# Patient Record
Sex: Female | Born: 1955 | Race: White | Hispanic: No | Marital: Married | Smoking: Never smoker
Health system: Southern US, Community
[De-identification: ages and names within clinical notes are randomized; demographics above are authoritative.]

## PROBLEM LIST (undated history)

## (undated) DIAGNOSIS — F419 Anxiety disorder, unspecified: Secondary | ICD-10-CM

## (undated) DIAGNOSIS — F32A Depression, unspecified: Secondary | ICD-10-CM

## (undated) DIAGNOSIS — E785 Hyperlipidemia, unspecified: Secondary | ICD-10-CM

## (undated) DIAGNOSIS — Z9889 Other specified postprocedural states: Secondary | ICD-10-CM

## (undated) DIAGNOSIS — Z923 Personal history of irradiation: Secondary | ICD-10-CM

## (undated) DIAGNOSIS — K648 Other hemorrhoids: Secondary | ICD-10-CM

## (undated) DIAGNOSIS — R112 Nausea with vomiting, unspecified: Secondary | ICD-10-CM

## (undated) DIAGNOSIS — F329 Major depressive disorder, single episode, unspecified: Secondary | ICD-10-CM

## (undated) DIAGNOSIS — K219 Gastro-esophageal reflux disease without esophagitis: Secondary | ICD-10-CM

## (undated) DIAGNOSIS — E039 Hypothyroidism, unspecified: Secondary | ICD-10-CM

## (undated) HISTORY — DX: Other hemorrhoids: K64.8

## (undated) HISTORY — PX: BREAST SURGERY: SHX581

## (undated) HISTORY — PX: GYNECOLOGIC CRYOSURGERY: SHX857

## (undated) HISTORY — DX: Other specified postprocedural states: R11.2

## (undated) HISTORY — DX: Depression, unspecified: F32.A

## (undated) HISTORY — DX: Hypothyroidism, unspecified: E03.9

## (undated) HISTORY — DX: Hyperlipidemia, unspecified: E78.5

## (undated) HISTORY — DX: Major depressive disorder, single episode, unspecified: F32.9

---
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
## 1998-02-22 ENCOUNTER — Encounter: Admission: RE | Admit: 1998-02-22 | Discharge: 1998-05-23 | Payer: Self-pay | Admitting: Anesthesiology

## 1998-11-07 ENCOUNTER — Other Ambulatory Visit: Admission: RE | Admit: 1998-11-07 | Discharge: 1998-11-07 | Payer: Self-pay | Admitting: Obstetrics and Gynecology

## 1999-11-19 ENCOUNTER — Other Ambulatory Visit: Admission: RE | Admit: 1999-11-19 | Discharge: 1999-11-19 | Payer: Self-pay | Admitting: Obstetrics and Gynecology

## 2000-11-26 ENCOUNTER — Other Ambulatory Visit: Admission: RE | Admit: 2000-11-26 | Discharge: 2000-11-26 | Payer: Self-pay | Admitting: Obstetrics and Gynecology

## 2001-11-29 ENCOUNTER — Other Ambulatory Visit: Admission: RE | Admit: 2001-11-29 | Discharge: 2001-11-29 | Payer: Self-pay | Admitting: Obstetrics and Gynecology

## 2002-12-04 ENCOUNTER — Other Ambulatory Visit: Admission: RE | Admit: 2002-12-04 | Discharge: 2002-12-04 | Payer: Self-pay | Admitting: Obstetrics and Gynecology

## 2003-12-13 ENCOUNTER — Other Ambulatory Visit: Admission: RE | Admit: 2003-12-13 | Discharge: 2003-12-13 | Payer: Self-pay | Admitting: Obstetrics and Gynecology

## 2004-12-16 ENCOUNTER — Other Ambulatory Visit: Admission: RE | Admit: 2004-12-16 | Discharge: 2004-12-16 | Payer: Self-pay | Admitting: Obstetrics and Gynecology

## 2005-12-17 ENCOUNTER — Other Ambulatory Visit: Admission: RE | Admit: 2005-12-17 | Discharge: 2005-12-17 | Payer: Self-pay | Admitting: Obstetrics and Gynecology

## 2007-01-04 ENCOUNTER — Other Ambulatory Visit: Admission: RE | Admit: 2007-01-04 | Discharge: 2007-01-04 | Payer: Self-pay | Admitting: Obstetrics and Gynecology

## 2008-01-05 ENCOUNTER — Other Ambulatory Visit: Admission: RE | Admit: 2008-01-05 | Discharge: 2008-01-05 | Payer: Self-pay | Admitting: Obstetrics and Gynecology

## 2009-01-07 ENCOUNTER — Ambulatory Visit: Payer: Self-pay | Admitting: Obstetrics and Gynecology

## 2009-01-07 ENCOUNTER — Encounter: Payer: Self-pay | Admitting: Obstetrics and Gynecology

## 2009-01-07 ENCOUNTER — Other Ambulatory Visit: Admission: RE | Admit: 2009-01-07 | Discharge: 2009-01-07 | Payer: Self-pay | Admitting: Obstetrics and Gynecology

## 2010-01-08 ENCOUNTER — Other Ambulatory Visit: Admission: RE | Admit: 2010-01-08 | Discharge: 2010-01-08 | Payer: Self-pay | Admitting: Obstetrics and Gynecology

## 2010-01-08 ENCOUNTER — Ambulatory Visit: Payer: Self-pay | Admitting: Obstetrics and Gynecology

## 2011-01-16 ENCOUNTER — Other Ambulatory Visit (HOSPITAL_COMMUNITY)
Admission: RE | Admit: 2011-01-16 | Discharge: 2011-01-16 | Disposition: A | Discharge status: Home / Self Care | Payer: BC Managed Care – PPO | Source: Ambulatory Visit | Attending: Obstetrics and Gynecology | Admitting: Obstetrics and Gynecology

## 2011-01-16 ENCOUNTER — Other Ambulatory Visit: Payer: Self-pay | Admitting: Obstetrics and Gynecology

## 2011-01-16 ENCOUNTER — Encounter (INDEPENDENT_AMBULATORY_CARE_PROVIDER_SITE_OTHER): Payer: BC Managed Care – PPO | Admitting: Obstetrics and Gynecology

## 2011-01-16 DIAGNOSIS — R809 Proteinuria, unspecified: Secondary | ICD-10-CM

## 2011-01-16 DIAGNOSIS — N950 Postmenopausal bleeding: Secondary | ICD-10-CM

## 2011-01-16 DIAGNOSIS — Z124 Encounter for screening for malignant neoplasm of cervix: Secondary | ICD-10-CM | POA: Insufficient documentation

## 2011-01-16 DIAGNOSIS — Z01419 Encounter for gynecological examination (general) (routine) without abnormal findings: Secondary | ICD-10-CM

## 2011-02-17 ENCOUNTER — Ambulatory Visit (INDEPENDENT_AMBULATORY_CARE_PROVIDER_SITE_OTHER): Payer: BC Managed Care – PPO | Admitting: General Surgery

## 2011-02-17 ENCOUNTER — Encounter (INDEPENDENT_AMBULATORY_CARE_PROVIDER_SITE_OTHER): Payer: Self-pay | Admitting: General Surgery

## 2011-02-17 VITALS — BP 122/72 | HR 68 | Temp 98.0°F | Ht 65.0 in | Wt 207.4 lb

## 2011-02-17 DIAGNOSIS — K648 Other hemorrhoids: Secondary | ICD-10-CM

## 2011-02-17 NOTE — Patient Instructions (Signed)
High fiber diet-at least 30 grams per day.  Drink a lot of fluids.

## 2011-02-18 ENCOUNTER — Encounter (INDEPENDENT_AMBULATORY_CARE_PROVIDER_SITE_OTHER): Payer: Self-pay | Admitting: General Surgery

## 2011-02-18 DIAGNOSIS — K648 Other hemorrhoids: Secondary | ICD-10-CM | POA: Insufficient documentation

## 2011-02-18 NOTE — Progress Notes (Signed)
Subjective:     Patient ID: Laure Kidney, female   DOB: 1956-08-08, 55 y.o.   MRN: [id number]    BP 122/72  Pulse 68  Temp(Src) 98 F (36.7 C) (Temporal)  Ht 5\' 5"  (1.651 m)  Wt 207 lb 6.4 oz (94.076 kg)  BMI 34.51 kg/m2    HPI He is a 55 year old female referred for evaluation and treatment of symptomatic hemorrhoid disease. She states she is has had intermittent episodes of bleeding and prolapse but they have become more frequent recently. This occurs after she strains to have a bowel movement. She does not have to manually reduce the hemorrhoids. She's been using topical therapy but continues to have the symptoms and is interested in treatment for this. She did have a colonoscopy 5 years ago. When she does not have to strain to have a bowel movement she does not have the pain or prolapse or bleeding.  Review of Systems  Constitutional: Negative.   Respiratory: Negative.   Cardiovascular: Negative.   Gastrointestinal: Positive for anal bleeding.  Hematological: Negative.        Objective:   Physical Exam Gen.: Obese female in no acute distress pleasant and cooperative.  Abdomen: Soft, nontender, nondistended, no masses.  Anorectal: External skin tags are present in the left lateral position and right anterior lateral position. No fissure is noted. No abscess is present. On digital rectal exam normal sphincter tone is noted. No masses are felt. No blood is present.  Anoscopy: There are moderate sized internal hemorrhoids in the right anterior lateral and posterior lateral positions. There is a small internal hemorrhoid in the left position.    Assessment:     Bleeding and prolapsing internal hemorrhoids. This tends to occur when she is constipated and straining to have a bowel movement. She does not take laxatives.    Plan:     High fiber diet. Will attempt rubber band ligation in the office today.  Rubber band ligation was attempted of the right anterior lateral  hemorrhoid. However she was too uncomfortable and did not tolerate procedure so it was aborted. Will have her concentrate on high fiber diet and return in one month. Will attempt rubber band ligation at that time.

## 2011-02-26 DIAGNOSIS — Z1211 Encounter for screening for malignant neoplasm of colon: Secondary | ICD-10-CM

## 2011-05-07 ENCOUNTER — Telehealth: Payer: Self-pay

## 2011-05-07 NOTE — Telephone Encounter (Signed)
Some people will spot or bleed at this point. Has this happen before? For the moment now Rx needed.

## 2011-05-07 NOTE — Telephone Encounter (Signed)
TAKES ESTRADIOL 1MG . EVERY DAY AND GENERIC PROVERA DAYS 1-12 OF EVERY MONTH. ON DAY 12 YESTERDAY STARTED SPOTTING. SHE STATES YOU DID DETAILED TESTING MAY 2012. ANY REASON FOR CONCERN?

## 2011-05-07 NOTE — Telephone Encounter (Signed)
PT CLARIFIED AGAIN THAT AFTER OUR NURSE CALL OF 02/16/11 SHE HAS HAD LIGHT SPOTTING TO LIGHT PERIOD FLOW SINCE STARTING THE CYCLIC PROVERA. STATES IS CONSISTENTLY WILL START ABOUT DAY 10 OF TAKING THE PROVERA AND LAST UP TO  DAY 10-14 OF EVERY MONTH AND JUST HASN'T CALLED BACK PRIOR TO THIS MONTH. AND STATES FLOW WAS A LITTLE BIT HEAVIER THIS MONTH THAN THE PREVIOUS ONES."JUST ENOUGH TO KEEP A PAD ON" UNTIL IT COMPLETELY STOPS.

## 2011-05-08 NOTE — Telephone Encounter (Signed)
PER DR. G. THIS IS NORMAL FOR DOING CYCLIC PROVERA AND JUST KEEP TRACK OF IT IF IT CHANGES OR PERSISTS 7+DAYS OR VERY HEAVY. CALL BACK OR SET UP OV. PT. NOTIFIED OF ALL INFO.

## 2011-08-26 ENCOUNTER — Other Ambulatory Visit: Payer: Self-pay | Admitting: *Deleted

## 2011-08-26 MED ORDER — MEDROXYPROGESTERONE ACETATE 5 MG PO TABS: ORAL_TABLET | ORAL | Status: DC

## 2011-08-26 NOTE — Telephone Encounter (Signed)
Pt called asking for refill on provera 5 mg. rx sent to [facility]

## 2011-10-09 ENCOUNTER — Telehealth: Payer: Self-pay | Admitting: *Deleted

## 2011-10-09 NOTE — Telephone Encounter (Signed)
Pt called to follow up with bleeding she is taking provera days 1-12 every month. Pt is still having some bleeding, pt states it starts off light then heavy then light again. No extreme heavy flow. I told pt that according to 9/13 note this normal and to keep a calender. Pt told to call back if very heavy and or lasting more than 7 days.

## 2012-01-07 ENCOUNTER — Encounter: Payer: Self-pay | Admitting: Obstetrics and Gynecology

## 2012-01-07 DIAGNOSIS — J45909 Unspecified asthma, uncomplicated: Secondary | ICD-10-CM | POA: Insufficient documentation

## 2012-01-19 ENCOUNTER — Encounter: Payer: Self-pay | Admitting: Obstetrics and Gynecology

## 2012-01-19 ENCOUNTER — Ambulatory Visit (INDEPENDENT_AMBULATORY_CARE_PROVIDER_SITE_OTHER): Payer: BC Managed Care – PPO | Admitting: Obstetrics and Gynecology

## 2012-01-19 VITALS — BP 124/78 | Ht 65.0 in | Wt 199.0 lb

## 2012-01-19 DIAGNOSIS — Z01419 Encounter for gynecological examination (general) (routine) without abnormal findings: Secondary | ICD-10-CM

## 2012-01-19 MED ORDER — ESTRADIOL 1 MG PO TABS: 1.0000 mg | ORAL_TABLET | Freq: Every day | ORAL | Status: DC

## 2012-01-19 MED ORDER — MEDROXYPROGESTERONE ACETATE 5 MG PO TABS: ORAL_TABLET | ORAL | Status: DC

## 2012-01-19 NOTE — Progress Notes (Signed)
Patient came to see me today for her annual GYN exam. She continues happy on her HRT with relief of her symptoms. She has been on HRT since 2006. She is getting light monthly withdrawal period she is having no pelvic pain or dyspareunia. She does have diminished libido. She does not have vaginal dryness. She is due for her mammogram in July. She has had 2 normal bone densities. She was treated with cryosurgery for cervical dysplasia greater than 20 years ago. She has hemorrhoids that we give her suppositories for. Dr. Purnell Shoemaker tried to ban them  but was too uncomfortable. Patient's mother had breast cancer in her early 81s and is now deceased. There is no family history of ovarian cancer. She had a paternal aunt who had breast cancer at approximately 18.  Physical examination:Kim Julian Reil present. HEENT within normal limits. Neck: Thyroid not large. No masses. Supraclavicular nodes: not enlarged. Breasts: Examined in both sitting and lying  position. No skin changes and no masses. Abdomen: Soft no guarding rebound or masses or hernia. Pelvic: External: Within normal limits. BUS: Within normal limits. Vaginal:within normal limits. Good estrogen effect. No evidence of cystocele rectocele or enterocele. Cervix: clean. Uterus: Normal size and shape. Adnexa: No masses. Rectovaginal exam: Confirmatory and negative. Extremities: Within normal limits.  Assessment: #1. Menopausal symptoms #2. Hemorrhoids #3. Diminished libido #4. Mother with breast cancer in her early 74s.  Plan: Discussed increased risk of breast cancer with HRT. Discussed patch estrogen to reduce risk of DVT. For the moment patient will continue on oral estrogen. She will investigate BRCA1 and BRCA2 testing with her insurance. She was started on testosterone cream 2% at bedtime cliterally for libido.

## 2012-01-19 NOTE — Patient Instructions (Signed)
Investigate testing for BRCA-1 and BRCA-2.

## 2012-01-20 LAB — URINALYSIS W MICROSCOPIC + REFLEX CULTURE
Bilirubin Urine: NEGATIVE
Glucose, UA: NEGATIVE mg/dL
Specific Gravity, Urine: 1.023 (ref 1.005–1.030)
pH: 6 (ref 5.0–8.0)

## 2012-01-21 LAB — URINE CULTURE

## 2012-03-04 ENCOUNTER — Telehealth: Payer: Self-pay | Admitting: *Deleted

## 2012-03-04 NOTE — Telephone Encounter (Signed)
Pt called c/o yeast infection, requesting diflucan rx, pt informed Dr.G is out of office until July 31st, pt will try OTC monistat and if no relief she call on Monday and make office visit or go to urgent care over the weekend.

## 2012-07-06 ENCOUNTER — Telehealth: Payer: Self-pay | Admitting: *Deleted

## 2012-07-06 MED ORDER — PRAMOXINE-HC 1-2.5 % EX CREA: TOPICAL_CREAM | Freq: Three times a day (TID) | CUTANEOUS | Status: DC

## 2012-07-06 NOTE — Telephone Encounter (Signed)
Pt informed, rx sent 

## 2012-07-06 NOTE — Addendum Note (Signed)
Addended by: Aura Camps on: 07/06/2012 03:30 PM   Modules accepted: Orders

## 2012-07-06 NOTE — Telephone Encounter (Signed)
Pt called requesting new Rx for Pramosone 2.5% for hemorrhoids, old Rx has expired. Okay to fill?

## 2012-07-06 NOTE — Telephone Encounter (Signed)
Yes

## 2012-09-12 ENCOUNTER — Other Ambulatory Visit: Payer: Self-pay | Admitting: Obstetrics and Gynecology

## 2013-01-20 ENCOUNTER — Ambulatory Visit (INDEPENDENT_AMBULATORY_CARE_PROVIDER_SITE_OTHER): Payer: BC Managed Care – PPO | Admitting: Gynecology

## 2013-01-20 ENCOUNTER — Encounter: Payer: Self-pay | Admitting: Gynecology

## 2013-01-20 VITALS — BP 124/80 | Ht 65.0 in | Wt 214.0 lb

## 2013-01-20 DIAGNOSIS — Z79890 Hormone replacement therapy (postmenopausal): Secondary | ICD-10-CM

## 2013-01-20 DIAGNOSIS — Z01419 Encounter for gynecological examination (general) (routine) without abnormal findings: Secondary | ICD-10-CM

## 2013-01-20 MED ORDER — PROGESTERONE MICRONIZED 100 MG PO CAPS: 100.0000 mg | ORAL_CAPSULE | Freq: Every day | ORAL | Status: DC

## 2013-01-20 MED ORDER — ESTRADIOL 0.05 MG/24HR TD PTTW: 1.0000 | MEDICATED_PATCH | TRANSDERMAL | Status: DC

## 2013-01-20 NOTE — Patient Instructions (Addendum)
Wean from hormones as we discussed. Call me if you have any issues. Otherwise followup in one year for annual exam.

## 2013-01-20 NOTE — Progress Notes (Signed)
Teresa Love 10/11/1955 [id number]        57 y.o.  [id number] for annual exam.  Several issues noted below. Former patient of Dr. Eda Paschal  Past medical history,surgical history, medications, allergies, family history and social history were all reviewed and documented in the EPIC chart. ROS:  Was performed and pertinent positives and negatives are included in the history.  Exam: Kim assistant Filed Vitals:   01/20/13 0851  BP: 124/80  Height: 5\' 5"  (1.651 m)  Weight: 214 lb (97.07 kg)   General appearance  Normal Skin grossly normal Head/Neck normal with no cervical or supraclavicular adenopathy thyroid normal Lungs  clear Cardiac RR, without RMG Abdominal  soft, nontender, without masses, organomegaly or hernia Breasts  examined lying and sitting without masses, retractions, discharge or axillary adenopathy. Pelvic  Ext/BUS/vagina  normal   Cervix  normal   Uterus  axial to anteverted, normal size, shape and contour, midline and mobile nontender   Adnexa  Without masses or tenderness    Anus and perineum  normal   Rectovaginal  normal sphincter tone without palpated masses or tenderness.    Assessment/Plan:  57 y.o. [id number] female for annual exam.   1. HRT. Patient currently on estradiol 1 mg daily and Provera 5 mg the first 12 days of each month and having monthly withdrawal bleeding. Has been on HRT since the perimenopause for a number of years. I reviewed the issues of HRT to include the WHI study with increased risk of stroke heart attack DVT. The ACOG and NAMS statements for lowest dose per shortest period of time reviewed. Advantages of transdermal over oral first-pass effect also reviewed. The patient has never tried to wean. She's going to go ahead and do that now and see how she does. If she does have significant symptoms and wants to reinitiate HRT I prescribed Vivelle 0.05 patches and Prometrium 100 mg nightly 2. Decreased libido.  The patient has been using testosterone  2% applied around her clitoris although admits to not using regularly. I reviewed the issues of the use of testosterone with libido and the risks to include absorption with hair growth acne weight gain adverse lipid profile. Patient will decide if she wants to continue and if so we will refill at the pharmacy for her. 3. Mammography 02/2012. History of breast cancer in her mother age 40. No other history of ovarian or breast cancer. She previously discussed possible genetic testing with Dr. Eda Paschal but never followed up for this.  Again reviewed the issues of genetic testing to include what she would do with the results, increased surveillance such as breast and ovarian up to and including prophylactic surgeries to include mastectomy and salpingo-oophorectomy. The issues about passing mutation to children also discussed. After lengthy discussion she wants to discuss with a genetic counselor and we'll arrange this at [facility] 4. Pap smear 2012. No Pap smear done today. History of dysplasia with cryosurgery around age 41. Normal Pap smears since then. Plan repeat Pap smear next year at 3 year interval. 5. DEXA 2007 normal. Plan repeat at age 18. Increase calcium vitamin D reviewed. 6. Colonoscopy 7 years ago. Plan repeat at 10 year interval. 7. Health maintenance. Sees Dr. Donette Larry routinely and he does her lab work. Followup 1 year, sooner if any issues.    Dara Lords MD, 9:24 AM 01/20/2013

## 2013-01-24 ENCOUNTER — Telehealth: Payer: Self-pay | Admitting: *Deleted

## 2013-01-24 ENCOUNTER — Encounter: Payer: Self-pay | Admitting: Gynecology

## 2013-01-24 ENCOUNTER — Telehealth: Payer: Self-pay | Admitting: Genetic Counselor

## 2013-01-24 NOTE — Telephone Encounter (Signed)
Message copied by Aura Camps on Tue [name] 24, 2013  9:08 AM ------      Message from: Dara Lords      Created: Fri [name] 20, 2013 10:33 AM       Patient needs appointment with genetic counselor at [facility] in reference to family history of breast cancer to consider genetic testing ------

## 2013-01-24 NOTE — Telephone Encounter (Signed)
LVOM FOR PT TO RETURN CALL IN RE TO GENETIC APPT.  °

## 2013-01-24 NOTE — Telephone Encounter (Signed)
Referral faxed to [facility]. They will contact pt to schedule the below appt.  

## 2013-01-25 NOTE — Telephone Encounter (Signed)
appt 03/20/13 @ 11:00 am

## 2013-02-16 ENCOUNTER — Encounter: Payer: Self-pay | Admitting: Gynecology

## 2013-02-16 ENCOUNTER — Telehealth: Payer: Self-pay | Admitting: *Deleted

## 2013-02-16 ENCOUNTER — Ambulatory Visit (INDEPENDENT_AMBULATORY_CARE_PROVIDER_SITE_OTHER): Payer: BC Managed Care – PPO | Admitting: Gynecology

## 2013-02-16 DIAGNOSIS — L293 Anogenital pruritus, unspecified: Secondary | ICD-10-CM

## 2013-02-16 DIAGNOSIS — N898 Other specified noninflammatory disorders of vagina: Secondary | ICD-10-CM

## 2013-02-16 LAB — WET PREP FOR TRICH, YEAST, CLUE
Clue Cells Wet Prep HPF POC: NONE SEEN
Trich, Wet Prep: NONE SEEN

## 2013-02-16 MED ORDER — FLUCONAZOLE 150 MG PO TABS: 150.0000 mg | ORAL_TABLET | Freq: Once | ORAL | Status: DC

## 2013-02-16 MED ORDER — ESTRADIOL 0.05 MG/24HR TD PTTW: 1.0000 | MEDICATED_PATCH | TRANSDERMAL | Status: DC

## 2013-02-16 MED ORDER — MEDROXYPROGESTERONE ACETATE 5 MG PO TABS: ORAL_TABLET | ORAL | Status: DC

## 2013-02-16 NOTE — Patient Instructions (Signed)
Take one Diflucan pill. Repeat if needed. Use the other extra pills as needed for yeast symptoms.

## 2013-02-16 NOTE — Telephone Encounter (Signed)
Pt called requesting 90 day supply for vivelle dot patch 0.5 mg and provera 5 mg to mail order pharmacy, rx sent.

## 2013-02-16 NOTE — Progress Notes (Signed)
Patient presents with 2 day history of vaginal itching and discharge. Returned from vacation where it was for a hot. No odor. Does have a history of recurrent yeast infections in the past.  Exam was Administrator, [employer] vagina with thick white cakey discharge. Cervix normal. Uterus normal size midline mobile nontender. Adnexa without masses or tenderness.  Assessment and plan: Exam and symptoms consistent with yeast. Wet prep is negative. Treat with Diflucan 150 mg tablet #4 total given to have available to repeat as needed as she does get frequent yeast infections. Followup if symptoms persist or worsen.

## 2013-02-23 ENCOUNTER — Telehealth: Payer: Self-pay | Admitting: *Deleted

## 2013-02-23 MED ORDER — PROGESTERONE MICRONIZED 100 MG PO CAPS: 100.0000 mg | ORAL_CAPSULE | Freq: Every day | ORAL | Status: DC

## 2013-02-23 NOTE — Telephone Encounter (Signed)
Pt called incorrect rx was sent on 02/16/13 provera 5 mg was sent when progesterone 100 mg cap 1 po daily should have been sent. This rx was sent with refill until annual 2015.

## 2013-03-07 ENCOUNTER — Encounter: Payer: Self-pay | Admitting: Gynecology

## 2013-03-20 ENCOUNTER — Ambulatory Visit (HOSPITAL_BASED_OUTPATIENT_CLINIC_OR_DEPARTMENT_OTHER): Payer: BC Managed Care – PPO | Admitting: Genetic Counselor

## 2013-03-20 ENCOUNTER — Other Ambulatory Visit: Payer: BC Managed Care – PPO | Admitting: Lab

## 2013-03-20 ENCOUNTER — Encounter: Payer: Self-pay | Admitting: Genetic Counselor

## 2013-03-20 DIAGNOSIS — Z803 Family history of malignant neoplasm of breast: Secondary | ICD-10-CM

## 2013-03-20 NOTE — Progress Notes (Signed)
Dr.  Colin Broach requested a consultation for genetic counseling and risk assessment for Teresa Love, a 57 y.o. female, for discussion of her family history of breast and kidney cancer.  She presents to clinic today to discuss the possibility of a genetic predisposition to cancer, and to further clarify her risks, as well as her family members' risks for cancer.   HISTORY OF PRESENT ILLNESS: Teresa Love is a 57 y.o. female with no personal history of cancer.  She has been having mammagrams since she was 27 and has not found any lumps.  Her first colonoscopy was at age 25 and no polyps were found.  She is concerned about her risk for breast cancer and became teary when discussing her family history.  Past Medical History  Diagnosis Date  . Hyperlipidemia   . Depression   . Hemorrhoids, internal, with bleeding   . Perimenopausal vasomotor symptoms     STARTED HRT 01/2005  . Cervical dysplasia     Past Surgical History  Procedure Laterality Date  . Appendectomy  1975  . Breast surgery      Reduction  . Gynecologic cryosurgery  age 67    History   Social History  . Marital Status: Married    Spouse Name: N/A    Number of Children: 2  . Years of Education: N/A   Occupational History  .     Social History Main Topics  . Smoking status: Never Smoker   . Smokeless tobacco: Never Used  . Alcohol Use: 0.5 oz/week    1 drink(s) per week  . Drug Use: No  . Sexually Active: Yes    Birth Control/ Protection: Post-menopausal   Other Topics Concern  . None   Social History Narrative  . None    REPRODUCTIVE HISTORY AND PERSONAL RISK ASSESSMENT FACTORS: Menarche was at age 1.   postmenopausal Uterus Intact: yes Ovaries Intact: yes G3P2A1, first live birth at age 89  She has not previously undergone treatment for infertility.   Oral Contraceptive use: 10 years   She has used HRT in the past.    FAMILY HISTORY:  We obtained a detailed, 4-generation family  history.  Significant diagnoses are listed below: Family History  Problem Relation Age of Onset  . Hypertension Mother   . Breast cancer Mother 29  . Kidney cancer Mother 64  . Heart disease Father   . Hypertension Father   . Breast cancer Paternal Aunt     Age 49  . Tuberculosis Maternal Grandmother   The patient reports that she does not keep in touch with many of her family members so she is not fully aware of their cancer history.  Patient's ancestors are of [place] descent. There is no reported Ashkenazi Jewish ancestry. There si no  known consanguinity.  GENETIC COUNSELING ASSESSMENT: Teresa Love is a 57 y.o. female with a family history of breast and kidney canc3er which somewhat suggestive of a hereditary breast cancer syndrome and predisposition to cancer. We, therefore, discussed and recommended the following at today's visit.   DISCUSSION: We reviewed the characteristics, features and inheritance patterns of hereditary cancer syndromes. We also discussed genetic testing, including the appropriate family members to test, the process of testing, insurance coverage and turn-around-time for results. We discussed that identification of a hereditary cancer syndrome may help her care providers tailor the patients medical management. If a mutation indicating hereditary cancer syndrome is detected in this case, the National Comprehensive Cancer  Network recommendations would include increased cancer surveillance and possibly prophylactic surgery. If a mutation is detected, the patient will be referred back to the referring provider and to any additional appropriate care providers to discuss the relevant options.   If a mutation is not found in the patient, cancer surveillance options would be discussed for the patient according to the appropriate standard National Comprehensive Cancer Network and American Cancer Society guidelines, with consideration of their personal and family history  risk factors. In this case, the patient will be referred back to their care providers for discussions of management.   In order to estimate her chance of having a BRCA mutation, we used statistical models ([employer] and [employer]) and laboratory data that take into account her personal medical history, family history and ancestry.  Because each model is different, there can be a lot of variability in the risks they give.  Therefore, these numbers must be considered a rough range and not a precise risk of having a BRCA mutation.  These models estimate that she has approximately a 0.77-5% chance of having a mutation. Based on this assessment of her family and personal history, genetic testing is recommended.  Based on the patient's personal and family history, statistical models ([employer] and [employer])  and literature data were used to estimate her risk of developing breast cancer. These estimate her lifetime risk of developing breast cancer to be approximately 15% to 22.5%. This estimation does not take into account any genetic testing results. We recommended Teresa Love pursue genetic testing for Breast and ovarian cancer panel at [facility].   PLAN: After considering the risks, benefits, and limitations, CRISTIAN GRIEVES provided informed consent to pursue genetic testing and the blood sample will be sent to [facility] for analysis of the Breast/ovarian cancer apnel. We discussed the implications of a positive, negative and/ or variant of uncertain significance genetic test result. Results should be available within approximately 3-4 weeks' time, at which point they will be disclosed by telephone to Teresa Love, as will any additional her recommendations warranted by these results. Teresa Love will receive a summary of her genetic counseling visit and a copy of her results once available. This information will also be available in Epic. We encouraged Teresa Love to remain in contact  with cancer genetics annually so that we can continuously update the family history and inform her of any changes in cancer genetics and testing that may be of benefit for her family. Teresa Love's questions were answered to her satisfaction today. Our contact information was provided should additional questions or concerns arise.  Per the patient's request, we will contact her by telephone to discuss these results. A follow up genetic counseling visit will be scheduled if indicated.  The patient was seen for a total of 60 minutes, greater than 50% of which was spent face-to-face counseling.  This plan is being carried out per Dr. Feliz Beam recommendations.  This note will also be sent to the referring provider via the electronic medical record. The patient will be supplied with a summary of this genetic counseling discussion as well as educational information on the discussed hereditary cancer syndromes following the conclusion of their visit.   Patient was discussed with Dr. Drue Second.   _______________________________________________________________________ For Office Staff:  Number of people involved in session: 1 Was an Intern/ student involved with case: no

## 2013-04-12 ENCOUNTER — Telehealth: Payer: Self-pay | Admitting: Genetic Counselor

## 2013-04-12 ENCOUNTER — Encounter: Payer: Self-pay | Admitting: Genetic Counselor

## 2013-04-12 NOTE — Telephone Encounter (Signed)
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  Discussed that the experience of 2 other labs was that one has not seen this variant before, and another has seen it and differs in the classification.  They call it likely benign.  When looking in an open access database, the entries there are also conflicting with classifications of uncertain significance and likely benign.  I told her that we will recontact her when this is reclassified, and to please remember to keep her contact information up to date if she moves.

## 2013-04-13 ENCOUNTER — Telehealth: Payer: Self-pay | Admitting: *Deleted

## 2013-04-13 NOTE — Telephone Encounter (Signed)
Left message for pt to call.

## 2013-04-13 NOTE — Telephone Encounter (Signed)
Pt informed with the below note. 

## 2013-04-13 NOTE — Telephone Encounter (Signed)
Message copied by Aura Camps on Thu Apr 13, 2013  8:57 AM ------      Message from: Dara Lords      Created: Wed Apr 12, 2013  4:44 PM       Tell patient I would like to have her set up an appointment to talk to me about her genetic testing results. I know that she talk to the genetic counselor on the telephone but I just want to have this discussion face-to-face. ------

## 2013-04-18 ENCOUNTER — Ambulatory Visit (INDEPENDENT_AMBULATORY_CARE_PROVIDER_SITE_OTHER): Payer: BC Managed Care – PPO | Admitting: Gynecology

## 2013-04-18 ENCOUNTER — Encounter: Payer: Self-pay | Admitting: Gynecology

## 2013-04-18 DIAGNOSIS — Z803 Family history of malignant neoplasm of breast: Secondary | ICD-10-CM

## 2013-04-18 NOTE — Patient Instructions (Signed)
Follow up when due for your annual exam. 

## 2013-04-18 NOTE — Progress Notes (Signed)
Patient presents to discuss the results of her BRCA testing. 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  As per my discussion via the telephone with the genetic counselor Maylon Cos as well as the patient personally spoke with Maylon Cos, I reviewed the issues of period of unknown significance. Does not appear to be an overt deleterious mutation and one other laboratory classifies it as likely benign. I did review with the patient that ultimately this may be found to be deleterious but at this point there is not enough experience with this variant. It does not appear this finding warrants aggressive treatment such as prophylactic mastectomy or salpingo-oophorectomy I did review the option for possible increased surveillance such as breast MRIs not necessarily annually but every other or so years. Patient wants to think about this. Asked her to followup with genetic counseling if she has further questions and also to check back with them on a regular basis to see if more experience evolves with this variant.  She does continue on her HRT to sign against weaning. She is on the Vivelle patch and Prometrium 100 mg nightly. She's doing well with this and wants to continue. We again discussed possible increased risk of breast cancer associated with HRT and she is comfortable with continuing.

## 2013-06-22 ENCOUNTER — Other Ambulatory Visit: Payer: Self-pay | Admitting: Internal Medicine

## 2013-06-22 ENCOUNTER — Telehealth: Payer: Self-pay | Admitting: *Deleted

## 2013-06-22 DIAGNOSIS — E039 Hypothyroidism, unspecified: Secondary | ICD-10-CM

## 2013-06-22 NOTE — Telephone Encounter (Signed)
Pt currently taking vivelle dot Vivelle patch 0.5 mg and Prometrium 100 mg nightly she would like to stop taking HRT. Pt asked if there is a way to wean off medication? Please advise

## 2013-06-22 NOTE — Telephone Encounter (Signed)
Pt informed with the below note. 

## 2013-06-22 NOTE — Telephone Encounter (Signed)
Not really. One way would be to leave the Vivelle patch on for a week or so to let the hormone levels fall down. She can just stop her Prometrium.

## 2013-06-26 ENCOUNTER — Ambulatory Visit
Admission: RE | Admit: 2013-06-26 | Discharge: 2013-06-26 | Disposition: A | Discharge status: Home / Self Care | Payer: BC Managed Care – PPO | Source: Ambulatory Visit | Attending: Internal Medicine | Admitting: Internal Medicine

## 2013-06-26 DIAGNOSIS — E039 Hypothyroidism, unspecified: Secondary | ICD-10-CM

## 2014-01-29 ENCOUNTER — Ambulatory Visit (INDEPENDENT_AMBULATORY_CARE_PROVIDER_SITE_OTHER): Payer: BC Managed Care – PPO | Admitting: Gynecology

## 2014-01-29 ENCOUNTER — Other Ambulatory Visit (HOSPITAL_COMMUNITY)
Admission: RE | Admit: 2014-01-29 | Discharge: 2014-01-29 | Disposition: A | Discharge status: Home / Self Care | Payer: BC Managed Care – PPO | Source: Ambulatory Visit | Attending: Gynecology | Admitting: Gynecology

## 2014-01-29 ENCOUNTER — Other Ambulatory Visit: Payer: Self-pay

## 2014-01-29 ENCOUNTER — Encounter: Payer: Self-pay | Admitting: Gynecology

## 2014-01-29 VITALS — BP 120/78 | Ht 65.0 in | Wt 208.0 lb

## 2014-01-29 DIAGNOSIS — K649 Unspecified hemorrhoids: Secondary | ICD-10-CM

## 2014-01-29 DIAGNOSIS — Z01419 Encounter for gynecological examination (general) (routine) without abnormal findings: Secondary | ICD-10-CM | POA: Insufficient documentation

## 2014-01-29 DIAGNOSIS — Z1151 Encounter for screening for human papillomavirus (HPV): Secondary | ICD-10-CM | POA: Insufficient documentation

## 2014-01-29 MED ORDER — NYSTATIN-TRIAMCINOLONE 100000-0.1 UNIT/GM-% EX OINT: 1.0000 "application " | TOPICAL_OINTMENT | Freq: Two times a day (BID) | CUTANEOUS | Status: DC

## 2014-01-29 MED ORDER — PRAMOXINE-HC 1-2.5 % EX CREA: TOPICAL_CREAM | Freq: Three times a day (TID) | CUTANEOUS | Status: DC

## 2014-01-29 NOTE — Progress Notes (Signed)
Teresa Love [name] 02, 1956 [id number]        57 y.o.  [id number] for annual exam.  Several issues noted below.  Past medical history,surgical history, problem list, medications, allergies, family history and social history were all reviewed and documented as reviewed in the EPIC chart.  ROS:  12 system ROS performed with pertinent positives and negatives included in the history, assessment and plan.  Included Systems: General, HEENT, Neck, Cardiovascular, Pulmonary, Gastrointestinal, Genitourinary, Musculoskeletal, Dermatologic, Endocrine, Hematological, Neurologic, Psychiatric Additional significant findings :  Bladder symptoms as discussed below.   Exam: Kim assistant Filed Vitals:   01/29/14 1022  BP: 120/78  Height: '5\' 5"'  (1.651 m)  Weight: 208 lb (94.348 kg)   General appearance:  Normal affect, orientation and appearance. Skin: Grossly normal HEENT: Without gross lesions.  No cervical or supraclavicular adenopathy. Thyroid normal.  Lungs:  Clear without wheezing, rales or rhonchi Cardiac: RR, without RMG Abdominal:  Soft, nontender, without masses, guarding, rebound, organomegaly or hernia Breasts:  Examined lying and sitting without masses, retractions, discharge or axillary adenopathy. Pelvic:  Ext/BUS/vagina normal  Cervix normal. Pap/HPV  Uterus anteverted, normal size, shape and contour, midline and mobile nontender   Adnexa  Without masses or tenderness    Anus and perineum  Normal   Rectovaginal  Normal sphincter tone without palpated masses or tenderness. Old external hemorrhoids   Assessment/Plan:  58 y.o. [id number] female for annual exam.   1. Postmenopausal.  Patient had been on HRT and testosterone. She has stopped both of these and has done well without significant hot flashes, night sweats, vaginal dryness or dyspareunia. No vaginal bleeding. Will continue to monitor. Report any vaginal bleeding. 2. Urinary incontinence. Mixed pattern with both urgency and stress  symptoms. Options to include Kegal exercises and OAB medication up to and including referral to urology discussed. Patient wants to try over-the-counter Oxytrol. Will followup with me if continues to be an issue and she wants referral or trial of OAB prescription medication. Check urinalysis today. 3. Hemorrhoids. Occasional flares. She uses both hemorrhoid cream as well as antifungal. Mytrex refill given along with pramoxine/hydrocortisone cream to be used when necessary. 4. Pap smear 2012. Pap smear/HPV done today. Does have history of cryosurgery at age 34 with normal Pap smears afterwards. Plan repeat at 3-5 year interval assuming the Pap smear returns normal. 5. Family History of breast cancer. 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 Patient reminded to followup with the genetic counselors annually to make sure no further reporting on this variant. Continue annual mammography in July. SBE monthly reviewed. 6. Colonoscopy 8 years ago with reported interval at 10 years. 7. DEXA 2007 normal. Plan repeat at age 71. Increase calcium vitamin D reviewed. 8. Health maintenance. No blood work done as patient reports this all done through Dr. Deforest Hoyles office. Followup one year, sooner as needed.   Note: This document was prepared with digital dictation and possible smart phrase technology. Any transcriptional errors that result from this process are unintentional.   Anastasio Auerbach MD, 10:49 AM 01/29/2014

## 2014-01-29 NOTE — Patient Instructions (Signed)
Try the over the counter medication for your bladder called Oxytrol. Call me if your bladder continues to be an issue and you want to try one of the prescription medications or referral to urology.  You may obtain a copy of any labs that were done today by logging onto MyChart as outlined in the instructions provided with your AVS (after visit summary). The office will not call with normal lab results but certainly if there are any significant abnormalities then we will contact you.   Health Maintenance, Female A healthy lifestyle and preventative care can promote health and wellness.  Maintain regular health, dental, and eye exams.  Eat a healthy diet. Foods like vegetables, fruits, whole grains, low-fat dairy products, and lean protein foods contain the nutrients you need without too many calories. Decrease your intake of foods high in solid fats, added sugars, and salt. Get information about a proper diet from your caregiver, if necessary.  Regular physical exercise is one of the most important things you can do for your health. Most adults should get at least 150 minutes of moderate-intensity exercise (any activity that increases your heart rate and causes you to sweat) each week. In addition, most adults need muscle-strengthening exercises on 2 or more days a week.   Maintain a healthy weight. The body mass index (BMI) is a screening tool to identify possible weight problems. It provides an estimate of body fat based on height and weight. Your caregiver can help determine your BMI, and can help you achieve or maintain a healthy weight. For adults 20 years and older:  A BMI below 18.5 is considered underweight.  A BMI of 18.5 to 24.9 is normal.  A BMI of 25 to 29.9 is considered overweight.  A BMI of 30 and above is considered obese.  Maintain normal blood lipids and cholesterol by exercising and minimizing your intake of saturated fat. Eat a balanced diet with plenty of fruits and  vegetables. Blood tests for lipids and cholesterol should begin at age 51 and be repeated every 5 years. If your lipid or cholesterol levels are high, you are over 50, or you are a high risk for heart disease, you may need your cholesterol levels checked more frequently.Ongoing high lipid and cholesterol levels should be treated with medicines if diet and exercise are not effective.  If you smoke, find out from your caregiver how to quit. If you do not use tobacco, do not start.  Lung cancer screening is recommended for adults aged 77 80 years who are at high risk for developing lung cancer because of a history of smoking. Yearly low-dose computed tomography (CT) is recommended for people who have at least a 30-pack-year history of smoking and are a current smoker or have quit within the past 15 years. A pack year of smoking is smoking an average of 1 pack of cigarettes a day for 1 year (for example: 1 pack a day for 30 years or 2 packs a day for 15 years). Yearly screening should continue until the smoker has stopped smoking for at least 15 years. Yearly screening should also be stopped for people who develop a health problem that would prevent them from having lung cancer treatment.  If you are pregnant, do not drink alcohol. If you are breastfeeding, be very cautious about drinking alcohol. If you are not pregnant and choose to drink alcohol, do not exceed 1 drink per day. One drink is considered to be 12 ounces (355 mL) of beer,  5 ounces (148 mL) of wine, or 1.5 ounces (44 mL) of liquor.  Avoid use of street drugs. Do not share needles with anyone. Ask for help if you need support or instructions about stopping the use of drugs.  High blood pressure causes heart disease and increases the risk of stroke. Blood pressure should be checked at least every 1 to 2 years. Ongoing high blood pressure should be treated with medicines, if weight loss and exercise are not effective.  If you are 39 to 59 years  old, ask your caregiver if you should take aspirin to prevent strokes.  Diabetes screening involves taking a blood sample to check your fasting blood sugar level. This should be done once every 3 years, after age 47, if you are within normal weight and without risk factors for diabetes. Testing should be considered at a younger age or be carried out more frequently if you are overweight and have at least 1 risk factor for diabetes.  Breast cancer screening is essential preventative care for women. You should practice "breast self-awareness." This means understanding the normal appearance and feel of your breasts and may include breast self-examination. Any changes detected, no matter how small, should be reported to a caregiver. Women in their 61s and 30s should have a clinical breast exam (CBE) by a caregiver as part of a regular health exam every 1 to 3 years. After age 58, women should have a CBE every year. Starting at age 88, women should consider having a mammogram (breast X-ray) every year. Women who have a family history of breast cancer should talk to their caregiver about genetic screening. Women at a high risk of breast cancer should talk to their caregiver about having an MRI and a mammogram every year.  Breast cancer gene (BRCA)-related cancer risk assessment is recommended for women who have family members with BRCA-related cancers. BRCA-related cancers include breast, ovarian, tubal, and peritoneal cancers. Having family members with these cancers may be associated with an increased risk for harmful changes (mutations) in the breast cancer genes BRCA1 and BRCA2. Results of the assessment will determine the need for genetic counseling and BRCA1 and BRCA2 testing.  The Pap test is a screening test for cervical cancer. Women should have a Pap test starting at age 15. Between ages 40 and 48, Pap tests should be repeated every 2 years. Beginning at age 67, you should have a Pap test every 3 years  as long as the past 3 Pap tests have been normal. If you had a hysterectomy for a problem that was not cancer or a condition that could lead to cancer, then you no longer need Pap tests. If you are between ages 66 and 47, and you have had normal Pap tests going back 10 years, you no longer need Pap tests. If you have had past treatment for cervical cancer or a condition that could lead to cancer, you need Pap tests and screening for cancer for at least 20 years after your treatment. If Pap tests have been discontinued, risk factors (such as a new sexual partner) need to be reassessed to determine if screening should be resumed. Some women have medical problems that increase the chance of getting cervical cancer. In these cases, your caregiver may recommend more frequent screening and Pap tests.  The human papillomavirus (HPV) test is an additional test that may be used for cervical cancer screening. The HPV test looks for the virus that can cause the cell changes on the  cervix. The cells collected during the Pap test can be tested for HPV. The HPV test could be used to screen women aged 28 years and older, and should be used in women of any age who have unclear Pap test results. After the age of 29, women should have HPV testing at the same frequency as a Pap test.  Colorectal cancer can be detected and often prevented. Most routine colorectal cancer screening begins at the age of 39 and continues through age 65. However, your caregiver may recommend screening at an earlier age if you have risk factors for colon cancer. On a yearly basis, your caregiver may provide home test kits to check for hidden blood in the stool. Use of a small camera at the end of a tube, to directly examine the colon (sigmoidoscopy or colonoscopy), can detect the earliest forms of colorectal cancer. Talk to your caregiver about this at age 55, when routine screening begins. Direct examination of the colon should be repeated every 5 to 10  years through age 34, unless early forms of pre-cancerous polyps or small growths are found.  Hepatitis C blood testing is recommended for all people born from [birth year] through 1965 and any individual with known risks for hepatitis C.  Practice safe sex. Use condoms and avoid high-risk sexual practices to reduce the spread of sexually transmitted infections (STIs). Sexually active women aged 63 and younger should be checked for Chlamydia, which is a common sexually transmitted infection. Older women with new or multiple partners should also be tested for Chlamydia. Testing for other STIs is recommended if you are sexually active and at increased risk.  Osteoporosis is a disease in which the bones lose minerals and strength with aging. This can result in serious bone fractures. The risk of osteoporosis can be identified using a bone density scan. Women ages 80 and over and women at risk for fractures or osteoporosis should discuss screening with their caregivers. Ask your caregiver whether you should be taking a calcium supplement or vitamin D to reduce the rate of osteoporosis.  Menopause can be associated with physical symptoms and risks. Hormone replacement therapy is available to decrease symptoms and risks. You should talk to your caregiver about whether hormone replacement therapy is right for you.  Use sunscreen. Apply sunscreen liberally and repeatedly throughout the day. You should seek shade when your shadow is shorter than you. Protect yourself by wearing long sleeves, pants, a wide-brimmed hat, and sunglasses year round, whenever you are outdoors.  Notify your caregiver of new moles or changes in moles, especially if there is a change in shape or color. Also notify your caregiver if a mole is larger than the size of a pencil eraser.  Stay current with your immunizations. Document Released: 02/23/2011 Document Revised: 12/05/2012 Document Reviewed: 02/23/2011 [facility] Patient Information 2014  [place].

## 2014-01-30 LAB — URINALYSIS W MICROSCOPIC + REFLEX CULTURE
BILIRUBIN URINE: NEGATIVE
Bacteria, UA: NONE SEEN
Casts: NONE SEEN
Crystals: NONE SEEN
Glucose, UA: NEGATIVE mg/dL
Hgb urine dipstick: NEGATIVE
Ketones, ur: NEGATIVE mg/dL
Leukocytes, UA: NEGATIVE
Nitrite: NEGATIVE
PROTEIN: NEGATIVE mg/dL
Specific Gravity, Urine: 1.022 (ref 1.005–1.030)
Urobilinogen, UA: 0.2 mg/dL (ref 0.0–1.0)
pH: 7.5 (ref 5.0–8.0)

## 2014-01-30 LAB — CYTOLOGY - PAP

## 2014-02-01 ENCOUNTER — Other Ambulatory Visit: Payer: Self-pay | Admitting: Gynecology

## 2014-02-01 ENCOUNTER — Telehealth: Payer: Self-pay

## 2014-02-01 NOTE — Telephone Encounter (Signed)
25

## 2014-02-01 NOTE — Telephone Encounter (Signed)
Pharmacy notified.

## 2014-02-01 NOTE — Telephone Encounter (Signed)
2 days ago you prescribed pramoxine hcl cream for patient.  No strength was indicated.  It is available in 2.5 to 1% or 1 to 1%.  She needs to know which strength you would like patient to have.

## 2014-03-08 ENCOUNTER — Encounter: Payer: Self-pay | Admitting: Gynecology

## 2014-06-25 ENCOUNTER — Encounter: Payer: Self-pay | Admitting: Gynecology

## 2014-08-21 ENCOUNTER — Telehealth: Payer: Self-pay | Admitting: *Deleted

## 2014-08-21 MED ORDER — FLUCONAZOLE 150 MG PO TABS: 150.0000 mg | ORAL_TABLET | Freq: Once | ORAL | Status: DC

## 2014-08-21 NOTE — Telephone Encounter (Signed)
Pt called requesting diflucan tablet, c/o vaginal itching and slight white discharge. Pt fully aware OV best, but asked since she is knows this is yeast if Rx could be sent. Please advise

## 2014-08-21 NOTE — Telephone Encounter (Signed)
Rx sent, pt informed with the below.

## 2014-08-21 NOTE — Telephone Encounter (Signed)
Diflucan 150 mg tablet 1 dose okay.  Do not take her cholesterol medicine the day that she takes her Diflucan.

## 2014-08-30 ENCOUNTER — Telehealth: Payer: Self-pay | Admitting: *Deleted

## 2014-08-30 MED ORDER — TERCONAZOLE 0.8 % VA CREA
1.0000 | TOPICAL_CREAM | Freq: Every day | VAGINAL | Status: DC
Start: 2014-08-30 — End: 2015-02-01

## 2014-08-30 NOTE — Telephone Encounter (Signed)
Pt informed with the below note. Rx sent.  

## 2014-08-30 NOTE — Telephone Encounter (Signed)
Terazol 3 day cream, applicator nightly 3

## 2014-08-30 NOTE — Telephone Encounter (Signed)
Pt received Diflucan 150 x 1 on 08/21/14 telephone encounter still has some vaginal itching asked if refill could be sent.?

## 2015-02-01 ENCOUNTER — Encounter: Payer: Self-pay | Admitting: Gynecology

## 2015-02-01 ENCOUNTER — Ambulatory Visit (INDEPENDENT_AMBULATORY_CARE_PROVIDER_SITE_OTHER): Payer: BC Managed Care – PPO | Admitting: Gynecology

## 2015-02-01 VITALS — BP 120/74 | Ht 65.0 in | Wt 217.0 lb

## 2015-02-01 DIAGNOSIS — Z01419 Encounter for gynecological examination (general) (routine) without abnormal findings: Secondary | ICD-10-CM | POA: Diagnosis not present

## 2015-02-01 DIAGNOSIS — N952 Postmenopausal atrophic vaginitis: Secondary | ICD-10-CM | POA: Diagnosis not present

## 2015-02-01 NOTE — Patient Instructions (Signed)
You may obtain a copy of any labs that were done today by logging onto MyChart as outlined in the instructions provided with your AVS (after visit summary). The office will not call with normal lab results but certainly if there are any significant abnormalities then we will contact you.   Health Maintenance, Female A healthy lifestyle and preventative care can promote health and wellness.  Maintain regular health, dental, and eye exams.  Eat a healthy diet. Foods like vegetables, fruits, whole grains, low-fat dairy products, and lean protein foods contain the nutrients you need without too many calories. Decrease your intake of foods high in solid fats, added sugars, and salt. Get information about a proper diet from your caregiver, if necessary.  Regular physical exercise is one of the most important things you can do for your health. Most adults should get at least 150 minutes of moderate-intensity exercise (any activity that increases your heart rate and causes you to sweat) each week. In addition, most adults need muscle-strengthening exercises on 2 or more days a week.   Maintain a healthy weight. The body mass index (BMI) is a screening tool to identify possible weight problems. It provides an estimate of body fat based on height and weight. Your caregiver can help determine your BMI, and can help you achieve or maintain a healthy weight. For adults 20 years and older:  A BMI below 18.5 is considered underweight.  A BMI of 18.5 to 24.9 is normal.  A BMI of 25 to 29.9 is considered overweight.  A BMI of 30 and above is considered obese.  Maintain normal blood lipids and cholesterol by exercising and minimizing your intake of saturated fat. Eat a balanced diet with plenty of fruits and vegetables. Blood tests for lipids and cholesterol should begin at age 61 and be repeated every 5 years. If your lipid or cholesterol levels are high, you are over 50, or you are a high risk for heart  disease, you may need your cholesterol levels checked more frequently.Ongoing high lipid and cholesterol levels should be treated with medicines if diet and exercise are not effective.  If you smoke, find out from your caregiver how to quit. If you do not use tobacco, do not start.  Lung cancer screening is recommended for adults aged 33 80 years who are at high risk for developing lung cancer because of a history of smoking. Yearly low-dose computed tomography (CT) is recommended for people who have at least a 30-pack-year history of smoking and are a current smoker or have quit within the past 15 years. A pack year of smoking is smoking an average of 1 pack of cigarettes a day for 1 year (for example: 1 pack a day for 30 years or 2 packs a day for 15 years). Yearly screening should continue until the smoker has stopped smoking for at least 15 years. Yearly screening should also be stopped for people who develop a health problem that would prevent them from having lung cancer treatment.  If you are pregnant, do not drink alcohol. If you are breastfeeding, be very cautious about drinking alcohol. If you are not pregnant and choose to drink alcohol, do not exceed 1 drink per day. One drink is considered to be 12 ounces (355 mL) of beer, 5 ounces (148 mL) of wine, or 1.5 ounces (44 mL) of liquor.  Avoid use of street drugs. Do not share needles with anyone. Ask for help if you need support or instructions about stopping  the use of drugs.  High blood pressure causes heart disease and increases the risk of stroke. Blood pressure should be checked at least every 1 to 2 years. Ongoing high blood pressure should be treated with medicines, if weight loss and exercise are not effective.  If you are 59 to 60 years old, ask your caregiver if you should take aspirin to prevent strokes.  Diabetes screening involves taking a blood sample to check your fasting blood sugar level. This should be done once every 3  years, after age 91, if you are within normal weight and without risk factors for diabetes. Testing should be considered at a younger age or be carried out more frequently if you are overweight and have at least 1 risk factor for diabetes.  Breast cancer screening is essential preventative care for women. You should practice "breast self-awareness." This means understanding the normal appearance and feel of your breasts and may include breast self-examination. Any changes detected, no matter how small, should be reported to a caregiver. Women in their 66s and 30s should have a clinical breast exam (CBE) by a caregiver as part of a regular health exam every 1 to 3 years. After age 101, women should have a CBE every year. Starting at age 100, women should consider having a mammogram (breast X-ray) every year. Women who have a family history of breast cancer should talk to their caregiver about genetic screening. Women at a high risk of breast cancer should talk to their caregiver about having an MRI and a mammogram every year.  Breast cancer gene (BRCA)-related cancer risk assessment is recommended for women who have family members with BRCA-related cancers. BRCA-related cancers include breast, ovarian, tubal, and peritoneal cancers. Having family members with these cancers may be associated with an increased risk for harmful changes (mutations) in the breast cancer genes BRCA1 and BRCA2. Results of the assessment will determine the need for genetic counseling and BRCA1 and BRCA2 testing.  The Pap test is a screening test for cervical cancer. Women should have a Pap test starting at age 57. Between ages 25 and 35, Pap tests should be repeated every 2 years. Beginning at age 37, you should have a Pap test every 3 years as long as the past 3 Pap tests have been normal. If you had a hysterectomy for a problem that was not cancer or a condition that could lead to cancer, then you no longer need Pap tests. If you are  between ages 50 and 76, and you have had normal Pap tests going back 10 years, you no longer need Pap tests. If you have had past treatment for cervical cancer or a condition that could lead to cancer, you need Pap tests and screening for cancer for at least 20 years after your treatment. If Pap tests have been discontinued, risk factors (such as a new sexual partner) need to be reassessed to determine if screening should be resumed. Some women have medical problems that increase the chance of getting cervical cancer. In these cases, your caregiver may recommend more frequent screening and Pap tests.  The human papillomavirus (HPV) test is an additional test that may be used for cervical cancer screening. The HPV test looks for the virus that can cause the cell changes on the cervix. The cells collected during the Pap test can be tested for HPV. The HPV test could be used to screen women aged 44 years and older, and should be used in women of any age  who have unclear Pap test results. After the age of 55, women should have HPV testing at the same frequency as a Pap test.  Colorectal cancer can be detected and often prevented. Most routine colorectal cancer screening begins at the age of 44 and continues through age 20. However, your caregiver may recommend screening at an earlier age if you have risk factors for colon cancer. On a yearly basis, your caregiver may provide home test kits to check for hidden blood in the stool. Use of a small camera at the end of a tube, to directly examine the colon (sigmoidoscopy or colonoscopy), can detect the earliest forms of colorectal cancer. Talk to your caregiver about this at age 86, when routine screening begins. Direct examination of the colon should be repeated every 5 to 10 years through age 13, unless early forms of pre-cancerous polyps or small growths are found.  Hepatitis C blood testing is recommended for all people born from [birth year] through 1966 and any  individual with known risks for hepatitis C.  Practice safe sex. Use condoms and avoid high-risk sexual practices to reduce the spread of sexually transmitted infections (STIs). Sexually active women aged 36 and younger should be checked for Chlamydia, which is a common sexually transmitted infection. Older women with new or multiple partners should also be tested for Chlamydia. Testing for other STIs is recommended if you are sexually active and at increased risk.  Osteoporosis is a disease in which the bones lose minerals and strength with aging. This can result in serious bone fractures. The risk of osteoporosis can be identified using a bone density scan. Women ages 20 and over and women at risk for fractures or osteoporosis should discuss screening with their caregivers. Ask your caregiver whether you should be taking a calcium supplement or vitamin D to reduce the rate of osteoporosis.  Menopause can be associated with physical symptoms and risks. Hormone replacement therapy is available to decrease symptoms and risks. You should talk to your caregiver about whether hormone replacement therapy is right for you.  Use sunscreen. Apply sunscreen liberally and repeatedly throughout the day. You should seek shade when your shadow is shorter than you. Protect yourself by wearing long sleeves, pants, a wide-brimmed hat, and sunglasses year round, whenever you are outdoors.  Notify your caregiver of new moles or changes in moles, especially if there is a change in shape or color. Also notify your caregiver if a mole is larger than the size of a pencil eraser.  Stay current with your immunizations. Document Released: 02/23/2011 Document Revised: 12/05/2012 Document Reviewed: 02/23/2011 [facility] Patient Information 2014 [place].

## 2015-02-01 NOTE — Progress Notes (Signed)
Teresa Love 12-19-1955 [id number]        59 y.o.  [id number] for annual exam.  Doing well.  Past medical history,surgical history, problem list, medications, allergies, family history and social history were all reviewed and documented as reviewed in the EPIC chart.  ROS:  Performed with pertinent positives and negatives included in the history, assessment and plan.   Additional significant findings :  none   Exam: [facility] person Vitals:   02/01/15 1402  BP: 120/74  Height: 5\' 5"  (1.651 m)  Weight: 217 lb (98.431 kg)   General appearance:  Normal affect, orientation and appearance. Skin: Grossly normal HEENT: Without gross lesions.  No cervical or supraclavicular adenopathy. Thyroid normal.  Lungs:  Clear without wheezing, rales or rhonchi Cardiac: RR, without RMG Abdominal:  Soft, nontender, without masses, guarding, rebound, organomegaly or hernia Breasts:  Examined lying and sitting without masses, retractions, discharge or axillary adenopathy. Pelvic:  Ext/BUS/vagina normal with mild atrophic changes  Cervix normal  Uterus anteverted, normal size, shape and contour, midline and mobile nontender   Adnexa  Without masses or tenderness    Anus and perineum  Normal with old external hemorrhoids  Rectovaginal  Normal sphincter tone without palpated masses or tenderness.    Assessment/Plan:  59 y.o. [id number] female for annual exam.   1. Post menopausal/atrophic genital changes. Patient without significant hot flushes, night sweats, vaginal dryness or any vaginal bleeding. Continue to monitor and report any vaginal bleeding. 2. Last year was having issues with her hemorrhoids and incontinence. These issues seem to have improved and they're no longer an issue to her. 3. Mammography 02/2014. Patient reminded to schedule her mammography this coming July she agrees to do so. SBE monthly reviewed. 4. Pap smear 2015. No Pap smear done today. History of cryosurgery at age 59. Plan  repeat Pap smear in 3 year interval. 5. DEXA 2007 normal. We'll plan repeat further into the menopause. Increase calcium vitamin D reviewed. 6. Colonoscopy 9 years ago with planned repeat this coming year. Patient knows to follow up and schedule this when due. 7. Health maintenance. No routine blood work done as she has this done at her primary physician's office. Follow up in one year, sooner as needed.     Dara Lords MD, 2:40 PM 02/01/2015

## 2015-02-02 LAB — URINALYSIS W MICROSCOPIC + REFLEX CULTURE
BILIRUBIN URINE: NEGATIVE
CASTS: NONE SEEN
Crystals: NONE SEEN
Glucose, UA: NEGATIVE mg/dL
Hgb urine dipstick: NEGATIVE
Ketones, ur: NEGATIVE mg/dL
Nitrite: NEGATIVE
PROTEIN: NEGATIVE mg/dL
Specific Gravity, Urine: 1.021 (ref 1.005–1.030)
UROBILINOGEN UA: 0.2 mg/dL (ref 0.0–1.0)
pH: 5 (ref 5.0–8.0)

## 2015-02-03 LAB — URINE CULTURE
Colony Count: NO GROWTH
ORGANISM ID, BACTERIA: NO GROWTH

## 2015-03-12 ENCOUNTER — Encounter: Payer: Self-pay | Admitting: Gynecology

## 2015-09-19 ENCOUNTER — Telehealth: Payer: Self-pay | Admitting: *Deleted

## 2015-09-19 MED ORDER — FLUCONAZOLE 150 MG PO TABS
ORAL_TABLET | ORAL | Status: DC
Start: 2015-09-19 — End: 2016-02-12

## 2015-09-19 NOTE — Telephone Encounter (Signed)
Pt called c/o yeast infection itching and white discharge, requesting Diflucan # 2 tablet, unable to come for OV today. Please advise

## 2015-09-19 NOTE — Telephone Encounter (Signed)
Pt aware, Rx sent. 

## 2015-09-19 NOTE — Telephone Encounter (Signed)
Okay for Diflucan 150 mg #2 tablets one by mouth as needed. Office visit if symptoms persist

## 2015-09-23 ENCOUNTER — Telehealth (HOSPITAL_COMMUNITY): Payer: Self-pay | Admitting: *Deleted

## 2015-09-23 NOTE — Telephone Encounter (Signed)
Left message on cell #[email or web address]  in reference to upcoming appointment scheduled for 09/25/15. Attempted home # and I got disconnected x 3.Phone number given for a call back so details instructions can be given. Euphemia Lingerfelt, Adelene Idler

## 2015-09-24 ENCOUNTER — Other Ambulatory Visit (HOSPITAL_COMMUNITY): Payer: Self-pay | Admitting: Internal Medicine

## 2015-09-24 ENCOUNTER — Telehealth (HOSPITAL_COMMUNITY): Payer: Self-pay | Admitting: *Deleted

## 2015-09-24 DIAGNOSIS — R079 Chest pain, unspecified: Secondary | ICD-10-CM

## 2015-09-24 NOTE — Telephone Encounter (Signed)
Patient given detailed instructions per Myocardial Perfusion Study Information Sheet for the test on 09/25/15 at 0730. Patient notified to arrive 15 minutes early and that it is imperative to arrive on time for appointment to keep from having the test rescheduled.  If you need to cancel or reschedule your appointment, please call the office within 24 hours of your appointment. Failure to do so may result in a cancellation of your appointment, and a $50 no show fee. Patient verbalized understanding.Teresa Love    

## 2015-09-25 ENCOUNTER — Ambulatory Visit (HOSPITAL_COMMUNITY): Payer: BC Managed Care – PPO | Attending: Cardiovascular Disease

## 2015-09-25 DIAGNOSIS — R9439 Abnormal result of other cardiovascular function study: Secondary | ICD-10-CM | POA: Diagnosis not present

## 2015-09-25 DIAGNOSIS — R079 Chest pain, unspecified: Secondary | ICD-10-CM | POA: Diagnosis not present

## 2015-09-25 LAB — MYOCARDIAL PERFUSION IMAGING
CHL CUP MPHR: 160 {beats}/min
CSEPED: 6 min
CSEPEDS: 0 s
CSEPEW: 7 METS
LV sys vol: 37 mL
LVDIAVOL: 87 mL
Peak HR: 155 {beats}/min
Percent HR: 96 %
RATE: 0.36
Rest HR: 70 {beats}/min
SDS: 4
SRS: 7
SSS: 11
TID: 0.89

## 2015-09-25 MED ORDER — TECHNETIUM TC 99M SESTAMIBI GENERIC - CARDIOLITE
10.8000 | Freq: Once | INTRAVENOUS | Status: AC | PRN
  Administered 2015-09-25: 11 via INTRAVENOUS

## 2015-09-25 MED ORDER — TECHNETIUM TC 99M SESTAMIBI GENERIC - CARDIOLITE
32.0000 | Freq: Once | INTRAVENOUS | Status: AC | PRN
  Administered 2015-09-25: 32 via INTRAVENOUS

## 2015-10-03 ENCOUNTER — Ambulatory Visit (INDEPENDENT_AMBULATORY_CARE_PROVIDER_SITE_OTHER): Payer: BC Managed Care – PPO | Admitting: Internal Medicine

## 2015-10-03 ENCOUNTER — Encounter: Payer: Self-pay | Admitting: Internal Medicine

## 2015-10-03 ENCOUNTER — Encounter: Payer: Self-pay | Admitting: *Deleted

## 2015-10-03 VITALS — BP 128/82 | HR 72 | Ht 68.5 in | Wt 210.8 lb

## 2015-10-03 DIAGNOSIS — Z01812 Encounter for preprocedural laboratory examination: Secondary | ICD-10-CM

## 2015-10-03 DIAGNOSIS — R9439 Abnormal result of other cardiovascular function study: Secondary | ICD-10-CM

## 2015-10-03 LAB — CBC
HEMATOCRIT: 41.3 % (ref 36.0–46.0)
HEMOGLOBIN: 13.2 g/dL (ref 12.0–15.0)
MCH: 28.1 pg (ref 26.0–34.0)
MCHC: 32 g/dL (ref 30.0–36.0)
MCV: 88.1 fL (ref 78.0–100.0)
MPV: 9.3 fL (ref 8.6–12.4)
Platelets: 301 10*3/uL (ref 150–400)
RBC: 4.69 MIL/uL (ref 3.87–5.11)
RDW: 14.3 % (ref 11.5–15.5)
WBC: 6.2 10*3/uL (ref 4.0–10.5)

## 2015-10-03 LAB — BASIC METABOLIC PANEL
BUN: 12 mg/dL (ref 7–25)
CHLORIDE: 103 mmol/L (ref 98–110)
CO2: 24 mmol/L (ref 20–31)
CREATININE: 0.88 mg/dL (ref 0.50–0.99)
Calcium: 9.6 mg/dL (ref 8.6–10.4)
GLUCOSE: 86 mg/dL (ref 65–99)
POTASSIUM: 4.9 mmol/L (ref 3.5–5.3)
Sodium: 139 mmol/L (ref 135–146)

## 2015-10-03 NOTE — Progress Notes (Signed)
Cardiology Office Note   Date:  10/03/2015   ID:  Teresa Love, DOB 03/01/1956, MRN [id number]  PCP:  Georgann Housekeeper, MD  Cardiologist:   Dietrich Pates, MD    Pt referred for CP    History of Present Illness: Teresa Love is a 60 y.o. female with a history of CP after Crhistmas  Watching TV  Had sharp pain in middle of chest  Went out bilaterally   Lasted for 3 min hard  Went away  Drank pepsi  Then came back  Didn't feel  the rest of night    Doesn't  Remember what ate  Felt SOB  Some increased SOB and pleuritic   Was gonebefore went to bed  Occurred at 5 PM    Pt says she has had some SOB going up stairs  Ques age  Takes break  Husband concurs that she stops more often      Thyroid OK  (Dr Sharl Ma)  EKG done    Seen by Evie Lacks done  This showed ischemia in the anteroseptal and distal lateral wall       Current Outpatient Prescriptions  Medication Sig Dispense Refill  . ALPRAZolam (XANAX PO) Take 0.5 mg by mouth as needed (sleep/ anxiety).     Marland Kitchen aspirin 81 MG tablet Take 81 mg by mouth daily.    . Calcium-Vitamin D ([employer] CALCIUM-600/VIT D PO) Take by mouth daily.      . citalopram (CELEXA) 20 MG tablet Take 20 mg by mouth daily.      . Coenzyme Q10 (CO Q-10) 100 MG CAPS Take 1 tablet by mouth daily.    Marland Kitchen CONTRAVE 8-90 MG TB12 Take 8-90 mg by mouth 2 (two) times daily.     Marland Kitchen esomeprazole (NEXIUM) 40 MG capsule Take 40 mg by mouth daily before breakfast.    . fluconazole (DIFLUCAN) 150 MG tablet Take one tablet by mouth daily as needed. 2 tablet 0  . liothyronine (CYTOMEL) 5 MCG tablet Take 5 mcg by mouth daily.     . Naltrexone-Bupropion HCl (CONTRAVE PO) Take 1 tablet by mouth 2 (two) times daily.    . pravastatin (PRAVACHOL) 40 MG tablet Take 40 mg by mouth daily.      Marland Kitchen SYNTHROID 75 MCG tablet Take 75 mcg by mouth daily before breakfast.      No current facility-administered medications for this visit.    Allergies:   Review of patient's allergies indicates no  known allergies.   Past Medical History  Diagnosis Date  . Hyperlipidemia   . Depression   . Hemorrhoids, internal, with bleeding     Past Surgical History  Procedure Laterality Date  . Appendectomy  1975  . Breast surgery      Reduction  . Gynecologic cryosurgery  age 61     Social History:  The patient  reports that she has never smoked. She has never used smokeless tobacco. She reports that she drinks about 0.6 oz of alcohol per week. She reports that she does not use illicit drugs.   Family History:  The patient's family history includes Breast cancer in her paternal aunt; Breast cancer (age of onset: 45) in her mother; Heart disease in her father; Hypertension in her father and mother; Kidney cancer (age of onset: 37) in her mother; Tuberculosis in her maternal grandmother.    ROS:  Please see the history of present illness. All other systems are reviewed and  Negative to the above problem  except as noted.    PHYSICAL EXAM: VS:  BP 128/82 mmHg  Pulse 72  Ht 5' 8.5" (1.74 m)  Wt 210 lb 12.8 oz (95.618 kg)  BMI 31.58 kg/m2  GEN: Well nourished, well developed, in no acute distress HEENT: normal Neck: no JVD, carotid bruits, or masses Cardiac: RRR; no murmurs, rubs, or gallops,no edema  Respiratory:  clear to auscultation bilaterally, normal work of breathing GI: soft, nontender, nondistended, + BS  No hepatomegaly  MS: no deformity Moving all extremities   Skin: warm and dry, no rash Neuro:  Strength and sensation are intact Psych: euthymic mood, full affect   EKG:  EKG is ordered today.  SR 75 bpm     Lipid Panel No results found for: CHOL, TRIG, HDL, CHOLHDL, VLDL, LDLCALC, LDLDIRECT    Wt Readings from Last 3 Encounters:  10/03/15 210 lb 12.8 oz (95.618 kg)  09/25/15 212 lb (96.163 kg)  02/01/15 217 lb (98.431 kg)      ASSESSMENT AND PLAN:  1.  CP Somewhat atypical  More concerning is dyspea on exertion.  Pt has had nuclear stress test that shows  evidence of ischemia   Given this I would recomm L heart cath to define anatomy   Risks / benefits explained  Pt understands an agrees to proceed  Will sched with Mendel Ryder who cares for her father.  Continue current meds  Take ASA  Activities as tolerated      Disposition:   FU with me based on test results    Signed, Dietrich Pates, MD  10/03/2015 11:34 AM    [facility] [address] [place], [place], [place]  [address] Phone: [phone]; Fax: [phone]

## 2015-10-03 NOTE — Patient Instructions (Signed)
Your physician recommends that you continue on your current medications as directed. Please refer to the Current Medication list given to you today.  Your physician recommends that you return for lab work in: today (bmet, cbc, inr/pt)  Your physician has requested that you have a cardiac catheterization. Cardiac catheterization is used to diagnose and/or treat various heart conditions. Doctors may recommend this procedure for a number of different reasons. The most common reason is to evaluate chest pain. Chest pain can be a symptom of coronary artery disease (CAD), and cardiac catheterization can show whether plaque is narrowing or blocking your heart's arteries. This procedure is also used to evaluate the valves, as well as measure the blood flow and oxygen levels in different parts of your heart. For further information please visit www.cardiosmart.org. Please follow instruction sheet, as given.   

## 2015-10-04 LAB — PROTIME-INR
INR: 0.91 (ref <1.50)
Prothrombin Time: 12.3 seconds (ref 11.6–15.2)

## 2015-10-07 ENCOUNTER — Encounter (HOSPITAL_COMMUNITY)
Admission: RE | Disposition: A | Discharge status: Home / Self Care | Payer: Self-pay | Source: Ambulatory Visit | Attending: Interventional Cardiology

## 2015-10-07 ENCOUNTER — Encounter (HOSPITAL_COMMUNITY): Payer: Self-pay | Admitting: Interventional Cardiology

## 2015-10-07 ENCOUNTER — Ambulatory Visit (HOSPITAL_COMMUNITY)
Admission: RE | Admit: 2015-10-07 | Discharge: 2015-10-07 | Disposition: A | Discharge status: Home / Self Care | Payer: BC Managed Care – PPO | Source: Ambulatory Visit | Attending: Interventional Cardiology | Admitting: Interventional Cardiology

## 2015-10-07 DIAGNOSIS — R9439 Abnormal result of other cardiovascular function study: Secondary | ICD-10-CM

## 2015-10-07 DIAGNOSIS — Z8249 Family history of ischemic heart disease and other diseases of the circulatory system: Secondary | ICD-10-CM | POA: Insufficient documentation

## 2015-10-07 DIAGNOSIS — E785 Hyperlipidemia, unspecified: Secondary | ICD-10-CM | POA: Insufficient documentation

## 2015-10-07 DIAGNOSIS — F329 Major depressive disorder, single episode, unspecified: Secondary | ICD-10-CM | POA: Insufficient documentation

## 2015-10-07 DIAGNOSIS — R079 Chest pain, unspecified: Secondary | ICD-10-CM

## 2015-10-07 DIAGNOSIS — Z8719 Personal history of other diseases of the digestive system: Secondary | ICD-10-CM | POA: Insufficient documentation

## 2015-10-07 DIAGNOSIS — R931 Abnormal findings on diagnostic imaging of heart and coronary circulation: Secondary | ICD-10-CM

## 2015-10-07 DIAGNOSIS — Z7982 Long term (current) use of aspirin: Secondary | ICD-10-CM | POA: Insufficient documentation

## 2015-10-07 HISTORY — PX: CARDIAC CATHETERIZATION: SHX172

## 2015-10-07 SURGERY — LEFT HEART CATH AND CORONARY ANGIOGRAPHY

## 2015-10-07 MED ORDER — HEPARIN (PORCINE) IN NACL 2-0.9 UNIT/ML-% IJ SOLN
INTRAMUSCULAR | Status: DC | PRN
  Administered 2015-10-07: 1500 mL

## 2015-10-07 MED ORDER — LIDOCAINE HCL (PF) 1 % IJ SOLN
INTRAMUSCULAR | Status: DC | PRN
  Administered 2015-10-07: 3 mL

## 2015-10-07 MED ORDER — VERAPAMIL HCL 2.5 MG/ML IV SOLN
INTRAVENOUS | Status: DC | PRN
  Administered 2015-10-07: 10:00:00 via INTRA_ARTERIAL

## 2015-10-07 MED ORDER — MIDAZOLAM HCL 2 MG/2ML IJ SOLN
INTRAMUSCULAR | Status: AC
  Filled 2015-10-07: qty 2

## 2015-10-07 MED ORDER — SODIUM CHLORIDE 0.9 % WEIGHT BASED INFUSION: 3.0000 mL/kg/h | INTRAVENOUS | Status: DC

## 2015-10-07 MED ORDER — LIDOCAINE HCL (PF) 1 % IJ SOLN
INTRAMUSCULAR | Status: AC
  Filled 2015-10-07: qty 30

## 2015-10-07 MED ORDER — SODIUM CHLORIDE 0.9 % WEIGHT BASED INFUSION: 1.0000 mL/kg/h | INTRAVENOUS | Status: DC

## 2015-10-07 MED ORDER — FENTANYL CITRATE (PF) 100 MCG/2ML IJ SOLN
INTRAMUSCULAR | Status: AC
  Filled 2015-10-07: qty 2

## 2015-10-07 MED ORDER — VERAPAMIL HCL 2.5 MG/ML IV SOLN
INTRAVENOUS | Status: AC
  Filled 2015-10-07: qty 2

## 2015-10-07 MED ORDER — DIAZEPAM 2 MG PO TABS: 2.0000 mg | ORAL_TABLET | Freq: Four times a day (QID) | ORAL | Status: DC | PRN

## 2015-10-07 MED ORDER — OXYCODONE-ACETAMINOPHEN 5-325 MG PO TABS: 1.0000 | ORAL_TABLET | ORAL | Status: DC | PRN

## 2015-10-07 MED ORDER — ONDANSETRON HCL 4 MG/2ML IJ SOLN: 4.0000 mg | Freq: Four times a day (QID) | INTRAMUSCULAR | Status: DC | PRN

## 2015-10-07 MED ORDER — HEPARIN (PORCINE) IN NACL 2-0.9 UNIT/ML-% IJ SOLN
INTRAMUSCULAR | Status: AC
  Filled 2015-10-07: qty 1500

## 2015-10-07 MED ORDER — SODIUM CHLORIDE 0.9 % IV SOLN: 250.0000 mL | INTRAVENOUS | Status: DC | PRN

## 2015-10-07 MED ORDER — SODIUM CHLORIDE 0.9% FLUSH: 3.0000 mL | INTRAVENOUS | Status: DC | PRN

## 2015-10-07 MED ORDER — SODIUM CHLORIDE 0.9% FLUSH: 3.0000 mL | Freq: Two times a day (BID) | INTRAVENOUS | Status: DC

## 2015-10-07 MED ORDER — ACETAMINOPHEN 325 MG PO TABS: 650.0000 mg | ORAL_TABLET | ORAL | Status: DC | PRN

## 2015-10-07 MED ORDER — ASPIRIN 81 MG PO CHEW
81.0000 mg | CHEWABLE_TABLET | ORAL | Status: AC
  Administered 2015-10-07: 81 mg via ORAL

## 2015-10-07 MED ORDER — FENTANYL CITRATE (PF) 100 MCG/2ML IJ SOLN
INTRAMUSCULAR | Status: DC | PRN
  Administered 2015-10-07 (×2): 50 ug via INTRAVENOUS

## 2015-10-07 MED ORDER — HEPARIN SODIUM (PORCINE) 1000 UNIT/ML IJ SOLN
INTRAMUSCULAR | Status: DC | PRN
  Administered 2015-10-07: 4300 [IU] via INTRAVENOUS

## 2015-10-07 MED ORDER — ASPIRIN 81 MG PO CHEW
CHEWABLE_TABLET | ORAL | Status: AC
  Filled 2015-10-07: qty 1

## 2015-10-07 MED ORDER — SODIUM CHLORIDE 0.9 % WEIGHT BASED INFUSION
3.0000 mL/kg/h | INTRAVENOUS | Status: DC
  Administered 2015-10-07: 3 mL/kg/h via INTRAVENOUS

## 2015-10-07 MED ORDER — MIDAZOLAM HCL 2 MG/2ML IJ SOLN
INTRAMUSCULAR | Status: DC | PRN
  Administered 2015-10-07 (×3): 1 mg via INTRAVENOUS

## 2015-10-07 SURGICAL SUPPLY — 10 items
CATH INFINITI 5 FR JL3.5 (CATHETERS) ×3 IMPLANT
CATH INFINITI JR4 5F (CATHETERS) ×3 IMPLANT
DEVICE RAD COMP TR BAND LRG (VASCULAR PRODUCTS) ×3 IMPLANT
GLIDESHEATH SLEND A-KIT 6F 22G (SHEATH) ×3 IMPLANT
KIT HEART LEFT (KITS) ×3 IMPLANT
PACK CARDIAC CATHETERIZATION (CUSTOM PROCEDURE TRAY) ×3 IMPLANT
TRANSDUCER W/STOPCOCK (MISCELLANEOUS) ×3 IMPLANT
TUBING CIL FLEX 10 FLL-RA (TUBING) ×3 IMPLANT
WIRE HI TORQ VERSACORE-J 145CM (WIRE) ×3 IMPLANT
WIRE SAFE-T 1.5MM-J .035X260CM (WIRE) ×3 IMPLANT

## 2015-10-07 NOTE — Interval H&P Note (Signed)
Cath Lab Visit (complete for each Cath Lab visit)  Clinical Evaluation Leading to the Procedure:   ACS: No  Non-ACS:    Anginal Classification: CCS III  Anti-ischemic medical therapy: Minimal Therapy (1 class of medications)  Non-Invasive Test Results: Intermediate-risk stress test findings: cardiac mortality 1-3%/year  Prior CABG: No previous CABG      History and Physical Interval Note:  10/07/2015 7:20 AM  Teresa Love  has presented today for surgery, with the diagnosis of abnormal mioview/cp  The various methods of treatment have been discussed with the patient and family. After consideration of risks, benefits and other options for treatment, the patient has consented to  Procedure(s): Left Heart Cath and Coronary Angiography (N/A) as a surgical intervention .  The patient's history has been reviewed, patient examined, no change in status, stable for surgery.  I have reviewed the patient's chart and labs.  Questions were answered to the patient's satisfaction.     Lesleigh Noe

## 2015-10-07 NOTE — Research (Signed)
CADLAD Informed Consent   Subject Name: Teresa Love  Subject met inclusion and exclusion criteria.  The informed consent form, study requirements and expectations were reviewed with the subject and questions and concerns were addressed prior to the signing of the consent form.  The subject verbalized understanding of the trail requirements.  The subject agreed to participate in the CADLAD trial and signed the informed consent.  The informed consent was obtained prior to performance of any protocol-specific procedures for the subject.  A copy of the signed informed consent was given to the subject and a copy was placed in the subject's medical record.  Hedrick,[name] 10/07/2015, 7482

## 2015-10-07 NOTE — H&P (View-Only) (Signed)
Cardiology Office Note   Date:  10/03/2015   ID:  Teresa Love, DOB 03/01/1956, MRN [id number]  PCP:  Georgann Housekeeper, MD  Cardiologist:   Dietrich Pates, MD    Pt referred for CP    History of Present Illness: Teresa Love is a 60 y.o. female with a history of CP after Crhistmas  Watching TV  Had sharp pain in middle of chest  Went out bilaterally   Lasted for 3 min hard  Went away  Drank pepsi  Then came back  Didn't feel  the rest of night    Doesn't  Remember what ate  Felt SOB  Some increased SOB and pleuritic   Was gonebefore went to bed  Occurred at 5 PM    Pt says she has had some SOB going up stairs  Ques age  Takes break  Husband concurs that she stops more often      Thyroid OK  (Dr Sharl Ma)  EKG done    Seen by Evie Lacks done  This showed ischemia in the anteroseptal and distal lateral wall       Current Outpatient Prescriptions  Medication Sig Dispense Refill  . ALPRAZolam (XANAX PO) Take 0.5 mg by mouth as needed (sleep/ anxiety).     Marland Kitchen aspirin 81 MG tablet Take 81 mg by mouth daily.    . Calcium-Vitamin D (CVS CALCIUM-600/VIT D PO) Take by mouth daily.      . citalopram (CELEXA) 20 MG tablet Take 20 mg by mouth daily.      . Coenzyme Q10 (CO Q-10) 100 MG CAPS Take 1 tablet by mouth daily.    Marland Kitchen CONTRAVE 8-90 MG TB12 Take 8-90 mg by mouth 2 (two) times daily.     Marland Kitchen esomeprazole (NEXIUM) 40 MG capsule Take 40 mg by mouth daily before breakfast.    . fluconazole (DIFLUCAN) 150 MG tablet Take one tablet by mouth daily as needed. 2 tablet 0  . liothyronine (CYTOMEL) 5 MCG tablet Take 5 mcg by mouth daily.     . Naltrexone-Bupropion HCl (CONTRAVE PO) Take 1 tablet by mouth 2 (two) times daily.    . pravastatin (PRAVACHOL) 40 MG tablet Take 40 mg by mouth daily.      Marland Kitchen SYNTHROID 75 MCG tablet Take 75 mcg by mouth daily before breakfast.      No current facility-administered medications for this visit.    Allergies:   Review of patient's allergies indicates no  known allergies.   Past Medical History  Diagnosis Date  . Hyperlipidemia   . Depression   . Hemorrhoids, internal, with bleeding     Past Surgical History  Procedure Laterality Date  . Appendectomy  1975  . Breast surgery      Reduction  . Gynecologic cryosurgery  age 61     Social History:  The patient  reports that she has never smoked. She has never used smokeless tobacco. She reports that she drinks about 0.6 oz of alcohol per week. She reports that she does not use illicit drugs.   Family History:  The patient's family history includes Breast cancer in her paternal aunt; Breast cancer (age of onset: 45) in her mother; Heart disease in her father; Hypertension in her father and mother; Kidney cancer (age of onset: 37) in her mother; Tuberculosis in her maternal grandmother.    ROS:  Please see the history of present illness. All other systems are reviewed and  Negative to the above problem  except as noted.    PHYSICAL EXAM: VS:  BP 128/82 mmHg  Pulse 72  Ht 5' 8.5" (1.74 m)  Wt 210 lb 12.8 oz (95.618 kg)  BMI 31.58 kg/m2  GEN: Well nourished, well developed, in no acute distress HEENT: normal Neck: no JVD, carotid bruits, or masses Cardiac: RRR; no murmurs, rubs, or gallops,no edema  Respiratory:  clear to auscultation bilaterally, normal work of breathing GI: soft, nontender, nondistended, + BS  No hepatomegaly  MS: no deformity Moving all extremities   Skin: warm and dry, no rash Neuro:  Strength and sensation are intact Psych: euthymic mood, full affect   EKG:  EKG is ordered today.  SR 75 bpm     Lipid Panel No results found for: CHOL, TRIG, HDL, CHOLHDL, VLDL, LDLCALC, LDLDIRECT    Wt Readings from Last 3 Encounters:  10/03/15 210 lb 12.8 oz (95.618 kg)  09/25/15 212 lb (96.163 kg)  02/01/15 217 lb (98.431 kg)      ASSESSMENT AND PLAN:  1.  CP Somewhat atypical  More concerning is dyspea on exertion.  Pt has had nuclear stress test that shows  evidence of ischemia   Given this I would recomm L heart cath to define anatomy   Risks / benefits explained  Pt understands an agrees to proceed  Will sched with H Smith who cares for her father.  Continue current meds  Take ASA  Activities as tolerated      Disposition:   FU with me based on test results    Signed, Tyjuan Demetro, MD  10/03/2015 11:34 AM    [facility] [address], [place], [place]  [address] Phone: [phone]; Fax: [phone]     

## 2015-10-07 NOTE — Discharge Instructions (Signed)
Radial Site Care °Refer to this sheet in the next few weeks. These instructions provide you with information about caring for yourself after your procedure. Your health care provider may also give you more specific instructions. Your treatment has been planned according to current medical practices, but problems sometimes occur. Call your health care provider if you have any problems or questions after your procedure. °WHAT TO EXPECT AFTER THE PROCEDURE °After your procedure, it is typical to have the following: °· Bruising at the radial site that usually fades within 1-2 weeks. °· Blood collecting in the tissue (hematoma) that may be painful to the touch. It should usually decrease in size and tenderness within 1-2 weeks. °HOME CARE INSTRUCTIONS °· Take medicines only as directed by your health care provider. °· You may shower 24-48 hours after the procedure or as directed by your health care provider. Remove the bandage (dressing) and gently wash the site with plain soap and water. Pat the area dry with a clean towel. Do not rub the site, because this may cause bleeding. °· Do not take baths, swim, or use a hot tub until your health care provider approves. °· Check your insertion site every day for redness, swelling, or drainage. °· Do not apply powder or lotion to the site. °· Do not flex or bend the affected arm for 24 hours or as directed by your health care provider. °· Do not push or pull heavy objects with the affected arm for 24 hours or as directed by your health care provider. °· Do not lift over 10 lb (4.5 kg) for 5 days after your procedure or as directed by your health care provider. °· Ask your health care provider when it is okay to: °¨ Return to work or school. °¨ Resume usual physical activities or sports. °¨ Resume sexual activity. °· Do not drive home if you are discharged the same day as the procedure. Have someone else drive you. °· You may drive 24 hours after the procedure unless otherwise  instructed by your health care provider. °· Do not operate machinery or power tools for 24 hours after the procedure. °· If your procedure was done as an outpatient procedure, which means that you went home the same day as your procedure, a responsible adult should be with you for the first 24 hours after you arrive home. °· Keep all follow-up visits as directed by your health care provider. This is important. °SEEK MEDICAL CARE IF: °· You have a fever. °· You have chills. °· You have increased bleeding from the radial site. Hold pressure on the site and call 911. °SEEK IMMEDIATE MEDICAL CARE IF: °· You have unusual pain at the radial site. °· You have redness, warmth, or swelling at the radial site. °· You have drainage (other than a small amount of blood on the dressing) from the radial site. °· The radial site is bleeding, and the bleeding does not stop after 30 minutes of holding steady pressure on the site. °· Your arm or hand becomes pale, cool, tingly, or numb. °  °This information is not intended to replace advice given to you by your health care provider. Make sure you discuss any questions you have with your health care provider. °  °Document Released: 09/12/2010 Document Revised: 08/31/2014 Document Reviewed: 02/26/2014 °Elsevier Interactive Patient Education ©2016 [employer] ° °

## 2016-02-07 ENCOUNTER — Other Ambulatory Visit: Payer: Self-pay | Admitting: *Deleted

## 2016-02-07 NOTE — Telephone Encounter (Signed)
Pt is calling requesting the above cream for hemorrhoids, last had in 2016. Please advise

## 2016-02-11 MED ORDER — HYDROCORTISONE ACE-PRAMOXINE 2.5-1 % RE CREA: 1.0000 "application " | TOPICAL_CREAM | Freq: Three times a day (TID) | RECTAL | Status: DC

## 2016-02-12 ENCOUNTER — Encounter: Payer: Self-pay | Admitting: Gynecology

## 2016-02-12 ENCOUNTER — Ambulatory Visit (INDEPENDENT_AMBULATORY_CARE_PROVIDER_SITE_OTHER): Payer: BC Managed Care – PPO | Admitting: Gynecology

## 2016-02-12 VITALS — BP 122/78 | Ht 65.5 in | Wt 204.0 lb

## 2016-02-12 DIAGNOSIS — N952 Postmenopausal atrophic vaginitis: Secondary | ICD-10-CM | POA: Diagnosis not present

## 2016-02-12 DIAGNOSIS — Z01419 Encounter for gynecological examination (general) (routine) without abnormal findings: Secondary | ICD-10-CM

## 2016-02-12 MED ORDER — FLUCONAZOLE 150 MG PO TABS: ORAL_TABLET | ORAL | Status: DC

## 2016-02-12 NOTE — Progress Notes (Signed)
    Tanya NonesJanice O Nardo June 12, 1956 [id number]        60 y.o.  [id number]  for annual exam.  Several issues noted below.  Past medical history,surgical history, problem list, medications, allergies, family history and social history were all reviewed and documented as reviewed in the EPIC chart.  ROS:  Performed with pertinent positives and negatives included in the history, assessment and plan.   Additional significant findings :  None   Exam: Kennon PortelaKim Gardner assistant Filed Vitals:   02/12/16 1359  BP: 122/78  Height: 5' 5.5" (1.664 m)  Weight: 204 lb (92.534 kg)   General appearance:  Normal affect, orientation and appearance. Skin: Grossly normal HEENT: Without gross lesions.  No cervical or supraclavicular adenopathy. Thyroid normal.  Lungs:  Clear without wheezing, rales or rhonchi Cardiac: RR, without RMG Abdominal:  Soft, nontender, without masses, guarding, rebound, organomegaly or hernia Breasts:  Examined lying and sitting without masses, retractions, discharge or axillary adenopathy. With bilateral reduction scars Pelvic:  Ext/BUS/vagina with atrophic changes  Cervix with atrophic changes  Uterus anteverted, normal size, shape and contour, midline and mobile nontender   Adnexa without masses or tenderness    Anus and perineum normal   Rectovaginal normal sphincter tone without palpated masses or tenderness.    Assessment/Plan:  60 y.o. [id number] female for annual exam.  1. Without significant hot flushes and night sweats, vaginal dryness or any vaginal bleeding. Continue to monitor report any issues or vaginal bleeding. 2. History of breast cancer in mother at age 61. 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 No special follow up as far as surveillance intensity suggested. I did recommend the patient call the genetic counselors to follow up with them to make sure there is nothing more discovered about her variant and she agrees to do  so. 3. Pap smear/HPV 01/2014. No Pap smear done today.  History of cryosurgery age 60. 4. Colonoscopy 10 years ago. Due for repeat and she is going to call and schedule. 5. DEXA reported last year normal. I do not have a copy of this but she said it was normal. 6. History of recurrent yeast infections particularly during the summer. Diflucan 150 mg 2 tabs with one refill provided to take as needed. 7. No routine lab work done as this is done elsewhere. Follow up 1 year, sooner as needed. Health maintenance.     Dara LordsFONTAINE,Hesston Hitchens P MD, 2:20 PM 02/12/2016

## 2016-02-12 NOTE — Patient Instructions (Signed)
Follow up with the genetic counselors in reference to the "variant of unknown significance" that showed up when they did the genetic testing.  You may obtain a copy of any labs that were done today by logging onto MyChart as outlined in the instructions provided with your AVS (after visit summary). The office will not call with normal lab results but certainly if there are any significant abnormalities then we will contact you.   Health Maintenance Adopting a healthy lifestyle and getting preventive care can go a long way to promote health and wellness. Talk with your health care provider about what schedule of regular examinations is right for you. This is a good chance for you to check in with your provider about disease prevention and staying healthy. In between checkups, there are plenty of things you can do on your own. Experts have done a lot of research about which lifestyle changes and preventive measures are most likely to keep you healthy. Ask your health care provider for more information. WEIGHT AND DIET  Eat a healthy diet  Be sure to include plenty of vegetables, fruits, low-fat dairy products, and lean protein.  Do not eat a lot of foods high in solid fats, added sugars, or salt.  Get regular exercise. This is one of the most important things you can do for your health.  Most adults should exercise for at least 150 minutes each week. The exercise should increase your heart rate and make you sweat (moderate-intensity exercise).  Most adults should also do strengthening exercises at least twice a week. This is in addition to the moderate-intensity exercise.  Maintain a healthy weight  Body mass index (BMI) is a measurement that can be used to identify possible weight problems. It estimates body fat based on height and weight. Your health care provider can help determine your BMI and help you achieve or maintain a healthy weight.  For females 24 years of age and older:   A BMI  below 18.5 is considered underweight.  A BMI of 18.5 to 24.9 is normal.  A BMI of 25 to 29.9 is considered overweight.  A BMI of 30 and above is considered obese.  Watch levels of cholesterol and blood lipids  You should start having your blood tested for lipids and cholesterol at 61 years of age, then have this test every 5 years.  You may need to have your cholesterol levels checked more often if:  Your lipid or cholesterol levels are high.  You are older than 61 years of age.  You are at high risk for heart disease.  CANCER SCREENING   Lung Cancer  Lung cancer screening is recommended for adults 29-42 years old who are at high risk for lung cancer because of a history of smoking.  A yearly low-dose CT scan of the lungs is recommended for people who:  Currently smoke.  Have quit within the past 15 years.  Have at least a 30-pack-year history of smoking. A pack year is smoking an average of one pack of cigarettes a day for 1 year.  Yearly screening should continue until it has been 15 years since you quit.  Yearly screening should stop if you develop a health problem that would prevent you from having lung cancer treatment.  Breast Cancer  Practice breast self-awareness. This means understanding how your breasts normally appear and feel.  It also means doing regular breast self-exams. Let your health care provider know about any changes, no matter how small.  If you are in your 20s or 30s, you should have a clinical breast exam (CBE) by a health care provider every 1-3 years as part of a regular health exam.  If you are 40 or older, have a CBE every year. Also consider having a breast X-ray (mammogram) every year.  If you have a family history of breast cancer, talk to your health care provider about genetic screening.  If you are at high risk for breast cancer, talk to your health care provider about having an MRI and a mammogram every year.  Breast cancer gene  (BRCA) assessment is recommended for women who have family members with BRCA-related cancers. BRCA-related cancers include:  Breast.  Ovarian.  Tubal.  Peritoneal cancers.  Results of the assessment will determine the need for genetic counseling and BRCA1 and BRCA2 testing. Cervical Cancer Routine pelvic examinations to screen for cervical cancer are no longer recommended for nonpregnant women who are considered low risk for cancer of the pelvic organs (ovaries, uterus, and vagina) and who do not have symptoms. A pelvic examination may be necessary if you have symptoms including those associated with pelvic infections. Ask your health care provider if a screening pelvic exam is right for you.   The Pap test is the screening test for cervical cancer for women who are considered at risk.  If you had a hysterectomy for a problem that was not cancer or a condition that could lead to cancer, then you no longer need Pap tests.  If you are older than 65 years, and you have had normal Pap tests for the past 10 years, you no longer need to have Pap tests.  If you have had past treatment for cervical cancer or a condition that could lead to cancer, you need Pap tests and screening for cancer for at least 20 years after your treatment.  If you no longer get a Pap test, assess your risk factors if they change (such as having a new sexual partner). This can affect whether you should start being screened again.  Some women have medical problems that increase their chance of getting cervical cancer. If this is the case for you, your health care provider may recommend more frequent screening and Pap tests.  The human papillomavirus (HPV) test is another test that may be used for cervical cancer screening. The HPV test looks for the virus that can cause cell changes in the cervix. The cells collected during the Pap test can be tested for HPV.  The HPV test can be used to screen women 82 years of age and  older. Getting tested for HPV can extend the interval between normal Pap tests from three to five years.  An HPV test also should be used to screen women of any age who have unclear Pap test results.  After 61 years of age, women should have HPV testing as often as Pap tests.  Colorectal Cancer  This type of cancer can be detected and often prevented.  Routine colorectal cancer screening usually begins at 61 years of age and continues through 61 years of age.  Your health care provider may recommend screening at an earlier age if you have risk factors for colon cancer.  Your health care provider may also recommend using home test kits to check for hidden blood in the stool.  A small camera at the end of a tube can be used to examine your colon directly (sigmoidoscopy or colonoscopy). This is done to check for  the earliest forms of colorectal cancer.  Routine screening usually begins at age 71.  Direct examination of the colon should be repeated every 5-10 years through 61 years of age. However, you may need to be screened more often if early forms of precancerous polyps or small growths are found. Skin Cancer  Check your skin from head to toe regularly.  Tell your health care provider about any new moles or changes in moles, especially if there is a change in a mole's shape or color.  Also tell your health care provider if you have a mole that is larger than the size of a pencil eraser.  Always use sunscreen. Apply sunscreen liberally and repeatedly throughout the day.  Protect yourself by wearing long sleeves, pants, a wide-brimmed hat, and sunglasses whenever you are outside. HEART DISEASE, DIABETES, AND HIGH BLOOD PRESSURE   Have your blood pressure checked at least every 1-2 years. High blood pressure causes heart disease and increases the risk of stroke.  If you are between 84 years and 71 years old, ask your health care provider if you should take aspirin to prevent  strokes.  Have regular diabetes screenings. This involves taking a blood sample to check your fasting blood sugar level.  If you are at a normal weight and have a low risk for diabetes, have this test once every three years after 61 years of age.  If you are overweight and have a high risk for diabetes, consider being tested at a younger age or more often. PREVENTING INFECTION  Hepatitis B  If you have a higher risk for hepatitis B, you should be screened for this virus. You are considered at high risk for hepatitis B if:  You were born in a country where hepatitis B is common. Ask your health care provider which countries are considered high risk.  Your parents were born in a high-risk country, and you have not been immunized against hepatitis B (hepatitis B vaccine).  You have HIV or AIDS.  You use needles to inject street drugs.  You live with someone who has hepatitis B.  You have had sex with someone who has hepatitis B.  You get hemodialysis treatment.  You take certain medicines for conditions, including cancer, organ transplantation, and autoimmune conditions. Hepatitis C  Blood testing is recommended for:  Everyone born from [birth year] through 1966.  Anyone with known risk factors for hepatitis C. Sexually transmitted infections (STIs)  You should be screened for sexually transmitted infections (STIs) including gonorrhea and chlamydia if:  You are sexually active and are younger than 61 years of age.  You are older than 61 years of age and your health care provider tells you that you are at risk for this type of infection.  Your sexual activity has changed since you were last screened and you are at an increased risk for chlamydia or gonorrhea. Ask your health care provider if you are at risk.  If you do not have HIV, but are at risk, it may be recommended that you take a prescription medicine daily to prevent HIV infection. This is called pre-exposure prophylaxis  (PrEP). You are considered at risk if:  You are sexually active and do not regularly use condoms or know the HIV status of your partner(s).  You take drugs by injection.  You are sexually active with a partner who has HIV. Talk with your health care provider about whether you are at high risk of being infected with HIV. If you  choose to begin PrEP, you should first be tested for HIV. You should then be tested every 3 months for as long as you are taking PrEP.  PREGNANCY   If you are premenopausal and you may become pregnant, ask your health care provider about preconception counseling.  If you may become pregnant, take 400 to 800 micrograms (mcg) of folic acid every day.  If you want to prevent pregnancy, talk to your health care provider about birth control (contraception). OSTEOPOROSIS AND MENOPAUSE   Osteoporosis is a disease in which the bones lose minerals and strength with aging. This can result in serious bone fractures. Your risk for osteoporosis can be identified using a bone density scan.  If you are 31 years of age or older, or if you are at risk for osteoporosis and fractures, ask your health care provider if you should be screened.  Ask your health care provider whether you should take a calcium or vitamin D supplement to lower your risk for osteoporosis.  Menopause may have certain physical symptoms and risks.  Hormone replacement therapy may reduce some of these symptoms and risks. Talk to your health care provider about whether hormone replacement therapy is right for you.  HOME CARE INSTRUCTIONS   Schedule regular health, dental, and eye exams.  Stay current with your immunizations.   Do not use any tobacco products including cigarettes, chewing tobacco, or electronic cigarettes.  If you are pregnant, do not drink alcohol.  If you are breastfeeding, limit how much and how often you drink alcohol.  Limit alcohol intake to no more than 1 drink per day for  nonpregnant women. One drink equals 12 ounces of beer, 5 ounces of wine, or 1 ounces of hard liquor.  Do not use street drugs.  Do not share needles.  Ask your health care provider for help if you need support or information about quitting drugs.  Tell your health care provider if you often feel depressed.  Tell your health care provider if you have ever been abused or do not feel safe at home. Document Released: 02/23/2011 Document Revised: 12/25/2013 Document Reviewed: 07/12/2013 [facility] Patient Information 2015 [place], [place]. This information is not intended to replace advice given to you by your health care provider. Make sure you discuss any questions you have with your health care provider.

## 2016-03-20 ENCOUNTER — Encounter: Payer: Self-pay | Admitting: Gynecology

## 2016-05-07 ENCOUNTER — Other Ambulatory Visit: Payer: Self-pay | Admitting: Gastroenterology

## 2016-06-17 ENCOUNTER — Encounter (HOSPITAL_COMMUNITY): Payer: Self-pay | Admitting: *Deleted

## 2016-06-18 ENCOUNTER — Encounter (HOSPITAL_COMMUNITY): Payer: Self-pay | Admitting: *Deleted

## 2016-06-23 ENCOUNTER — Encounter (HOSPITAL_COMMUNITY)
Admission: RE | Disposition: A | Discharge status: Home / Self Care | Payer: Self-pay | Source: Ambulatory Visit | Attending: Gastroenterology

## 2016-06-23 ENCOUNTER — Ambulatory Visit (HOSPITAL_COMMUNITY): Payer: BC Managed Care – PPO | Admitting: Registered Nurse

## 2016-06-23 ENCOUNTER — Ambulatory Visit (HOSPITAL_COMMUNITY)
Admission: RE | Admit: 2016-06-23 | Discharge: 2016-06-23 | Disposition: A | Discharge status: Home / Self Care | Payer: BC Managed Care – PPO | Source: Ambulatory Visit | Attending: Gastroenterology | Admitting: Gastroenterology

## 2016-06-23 ENCOUNTER — Encounter (HOSPITAL_COMMUNITY): Payer: Self-pay | Admitting: *Deleted

## 2016-06-23 DIAGNOSIS — F329 Major depressive disorder, single episode, unspecified: Secondary | ICD-10-CM | POA: Diagnosis not present

## 2016-06-23 DIAGNOSIS — Z6833 Body mass index (BMI) 33.0-33.9, adult: Secondary | ICD-10-CM | POA: Diagnosis not present

## 2016-06-23 DIAGNOSIS — E669 Obesity, unspecified: Secondary | ICD-10-CM | POA: Insufficient documentation

## 2016-06-23 DIAGNOSIS — F419 Anxiety disorder, unspecified: Secondary | ICD-10-CM | POA: Insufficient documentation

## 2016-06-23 DIAGNOSIS — E039 Hypothyroidism, unspecified: Secondary | ICD-10-CM | POA: Insufficient documentation

## 2016-06-23 DIAGNOSIS — E7800 Pure hypercholesterolemia, unspecified: Secondary | ICD-10-CM | POA: Insufficient documentation

## 2016-06-23 DIAGNOSIS — Z1211 Encounter for screening for malignant neoplasm of colon: Secondary | ICD-10-CM | POA: Diagnosis not present

## 2016-06-23 DIAGNOSIS — K219 Gastro-esophageal reflux disease without esophagitis: Secondary | ICD-10-CM | POA: Insufficient documentation

## 2016-06-23 HISTORY — PX: COLONOSCOPY WITH PROPOFOL: SHX5780

## 2016-06-23 HISTORY — DX: Gastro-esophageal reflux disease without esophagitis: K21.9

## 2016-06-23 SURGERY — COLONOSCOPY WITH PROPOFOL
Anesthesia: Monitor Anesthesia Care

## 2016-06-23 MED ORDER — LIDOCAINE 2% (20 MG/ML) 5 ML SYRINGE
INTRAMUSCULAR | Status: DC | PRN
  Administered 2016-06-23: 100 mg via INTRAVENOUS

## 2016-06-23 MED ORDER — PROPOFOL 500 MG/50ML IV EMUL
INTRAVENOUS | Status: DC | PRN
  Administered 2016-06-23: 130 ug/kg/min via INTRAVENOUS

## 2016-06-23 MED ORDER — PROPOFOL 10 MG/ML IV BOLUS
INTRAVENOUS | Status: DC | PRN
  Administered 2016-06-23 (×3): 20 mg via INTRAVENOUS

## 2016-06-23 MED ORDER — SODIUM CHLORIDE 0.9 % IV SOLN: INTRAVENOUS | Status: DC

## 2016-06-23 MED ORDER — LACTATED RINGERS IV SOLN
INTRAVENOUS | Status: DC
  Administered 2016-06-23: 08:00:00 via INTRAVENOUS

## 2016-06-23 MED ORDER — PROPOFOL 10 MG/ML IV BOLUS
INTRAVENOUS | Status: AC
  Filled 2016-06-23: qty 20

## 2016-06-23 MED ORDER — PROPOFOL 10 MG/ML IV BOLUS
INTRAVENOUS | Status: AC
  Filled 2016-06-23: qty 40

## 2016-06-23 MED ORDER — LIDOCAINE 2% (20 MG/ML) 5 ML SYRINGE
INTRAMUSCULAR | Status: AC
  Filled 2016-06-23: qty 5

## 2016-06-23 SURGICAL SUPPLY — 21 items

## 2016-06-23 NOTE — Transfer of Care (Signed)
Immediate Anesthesia Transfer of Care Note  Patient: Teresa NonesJanice O Love  Procedure(s) Performed: Procedure(s): COLONOSCOPY WITH PROPOFOL (N/A)  Patient Location: PACU and Endoscopy Unit  Anesthesia Type:MAC  Level of Consciousness: awake, alert , oriented and patient cooperative  Airway & Oxygen Therapy: Patient Spontanous Breathing and Patient connected to face mask oxygen  Post-op Assessment: Report given to RN, Post -op Vital signs reviewed and stable and Patient moving all extremities  Post vital signs: Reviewed and stable  Last Vitals:  Vitals:   06/23/16 0823  BP: (!) 157/82  Pulse: 66  Resp: 11  Temp: 36.8 C    Last Pain:  Vitals:   06/23/16 0823  TempSrc: Oral         Complications: No apparent anesthesia complications

## 2016-06-23 NOTE — Op Note (Addendum)
[facility] Patient Name: Teresa DerryJanice Chenard Procedure Date: 06/23/2016 MRN: [id number] Attending MD: Charolett BumpersMartin K Johnson , MD Date of Birth: 01-Jul-1956 CSN: [id number] Age: [age] Admit Type: Outpatient Procedure:                Colonoscopy Indications:              Screening for colorectal malignant neoplasm Providers:                Charolett BumpersMartin K. Johnson, MD, Anthony Saraniel Madden, RN, [facility]                            Aiken Tech, Technician, Lauree ChandlerLacey A. Armistead, CRNA Referring MD:              Medicines:                Propofol per Anesthesia Complications:            No immediate complications. Estimated Blood Loss:     Estimated blood loss: none. Procedure:                Pre-Anesthesia Assessment:                           - Prior to the procedure, a History and Physical                            was performed, and patient medications and                            allergies were reviewed. The patient's tolerance of                            previous anesthesia was also reviewed. The risks                            and benefits of the procedure and the sedation                            options and risks were discussed with the patient.                            All questions were answered, and informed consent                            was obtained. Prior Anticoagulants: The patient has                            taken aspirin, last dose was 1 day prior to                            procedure. ASA Grade Assessment: II - A patient                            with mild systemic disease. After reviewing the  risks and benefits, the patient was deemed in                            satisfactory condition to undergo the procedure.                           After obtaining informed consent, the colonoscope                            was passed under direct vision. Throughout the                            procedure, the patient's blood pressure, pulse, and                             oxygen saturations were monitored continuously. The                            EC-[id number] ([id number]) scope was introduced through                            the anus and advanced to the the cecum, identified                            by appendiceal orifice and ileocecal valve. The                            colonoscopy was performed without difficulty. The                            patient tolerated the procedure well. The quality                            of the bowel preparation was good. The ileocecal                            valve, the appendiceal orifice and the rectum were                            photographed. Scope In: 8:45:28 AM Scope Out: 9:09:46 AM Scope Withdrawal Time: 0 hours 16 minutes 42 seconds  Total Procedure Duration: 0 hours 24 minutes 18 seconds  Findings:      The perianal and digital rectal examinations were normal.      The entire examined colon appeared normal. Impression:               - The entire examined colon is normal.                           - No specimens collected. Moderate Sedation:      N/A- Per Anesthesia Care Recommendation:           - Patient has a contact number available for  emergencies. The signs and symptoms of potential                            delayed complications were discussed with the                            patient. Return to normal activities tomorrow.                            Written discharge instructions were provided to the                            patient.                           - Repeat colonoscopy in 10 years for screening                            purposes.                           - Resume previous diet.                           - Continue present medications. Procedure Code(s):        --- Professional ---                           [id number], Colorectal cancer screening; colonoscopy on                            individual not meeting criteria for high  risk Diagnosis Code(s):        --- Professional ---                           Z12.11, Encounter for screening for malignant                            neoplasm of colon CPT copyright 2016 [employer]. All rights reserved. The codes documented in this report are preliminary and upon coder review may  be revised to meet current compliance requirements. Danise EdgeMartin Johnson, MD Charolett BumpersMartin K Johnson, MD 06/23/2016 9:15:23 AM This report has been signed electronically. Number of Addenda: 0

## 2016-06-23 NOTE — Anesthesia Preprocedure Evaluation (Addendum)
Anesthesia Evaluation  Patient identified by MRN, date of birth, ID band Patient awake    Reviewed: Allergy & Precautions, NPO status , Patient's Chart, lab work & pertinent test results  History of Anesthesia Complications Negative for: history of anesthetic complications  Airway Mallampati: II  TM Distance: >3 FB Neck ROM: Full    Dental  (+) Dental Advisory Given   Pulmonary neg pulmonary ROS,    breath sounds clear to auscultation       Cardiovascular (-) angina Rhythm:Regular Rate:Normal  2/17 cath: normal coronaries   Neuro/Psych Depression negative neurological ROS     GI/Hepatic Neg liver ROS, GERD  Medicated and Controlled,  Endo/Other  Hypothyroidism   Renal/GU negative Renal ROS     Musculoskeletal   Abdominal (+) + obese,   Peds  Hematology negative hematology ROS (+)   Anesthesia Other Findings   Reproductive/Obstetrics                            Anesthesia Physical Anesthesia Plan  ASA: II  Anesthesia Plan: MAC   Post-op Pain Management:    Induction: Intravenous  Airway Management Planned: Natural Airway  Additional Equipment:   Intra-op Plan:   Post-operative Plan:   Informed Consent: I have reviewed the patients History and Physical, chart, labs and discussed the procedure including the risks, benefits and alternatives for the proposed anesthesia with the patient or authorized representative who has indicated his/her understanding and acceptance.   Dental advisory given  Plan Discussed with: CRNA and Surgeon  Anesthesia Plan Comments: (Plan routine monitors, MAC)        Anesthesia Quick Evaluation

## 2016-06-23 NOTE — H&P (Signed)
Procedure: Screening colonoscopy. 03/10/2006 normal baseline screening colonoscopy was performed  History: The patient is a 60 year old female born 1956-04-18. She is scheduled to undergo a repeat screening colonoscopy today.  Past medical history: Hypercholesterolemia. Depression. Anxiety. Gastroesophageal reflux. Appendectomy. Bilateral breast reduction surgery.  Medication allergies: Statin drugs cause myalgia.  Family history: Mother was diagnosed with breast cancer  Exam: The patient is alert and lying comfortably on the endoscopy stretcher abdomen is soft and nontender to palpation. Lungs are clear to auscultation. Cardiac exam reveals a regular rhythm.  Plan: Proceed with screening colonoscopy

## 2016-06-23 NOTE — Discharge Instructions (Signed)

## 2016-06-23 NOTE — Anesthesia Postprocedure Evaluation (Signed)
Anesthesia Post Note  Patient: Teresa Love  Procedure(s) Performed: Procedure(s) (LRB): COLONOSCOPY WITH PROPOFOL (N/A)  Patient location during evaluation: Endoscopy Anesthesia Type: MAC Level of consciousness: awake and alert, oriented and patient cooperative Pain management: pain level controlled Vital Signs Assessment: post-procedure vital signs reviewed and stable Respiratory status: spontaneous breathing, nonlabored ventilation and respiratory function stable Cardiovascular status: blood pressure returned to baseline and stable Postop Assessment: no signs of nausea or vomiting Anesthetic complications: no    Last Vitals:  Vitals:   06/23/16 0935 06/23/16 0940  BP: 128/65 125/64  Pulse: (!) 52 (!) 54  Resp: 17 18  Temp:      Last Pain:  Vitals:   06/23/16 0915  TempSrc: Oral                 [name],E. Dekota Kirlin

## 2016-06-24 ENCOUNTER — Encounter (HOSPITAL_COMMUNITY): Payer: Self-pay | Admitting: Gastroenterology

## 2016-08-20 ENCOUNTER — Other Ambulatory Visit: Payer: Self-pay | Admitting: Internal Medicine

## 2016-08-20 ENCOUNTER — Ambulatory Visit
Admission: RE | Admit: 2016-08-20 | Discharge: 2016-08-20 | Disposition: A | Discharge status: Home / Self Care | Payer: BC Managed Care – PPO | Source: Ambulatory Visit | Attending: Internal Medicine | Admitting: Internal Medicine

## 2016-08-20 DIAGNOSIS — M25551 Pain in right hip: Secondary | ICD-10-CM

## 2017-01-21 IMAGING — NM NM MISC PROCEDURE
3 series · 18 of 18 positions shown · non-contrast
Comparison: none

[Series 1: stress-sum-em_(id)_sa · 6.4mm · 6.40mm/px · 6 of 64 frames shown]
[frame 6/64]
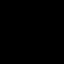
[frame 16/64]
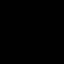
[frame 27/64]
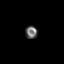
[frame 38/64]
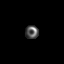
[frame 48/64]
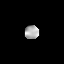
[frame 59/64]
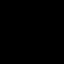

[Series 1: rest_(id)_sa · 6.4mm · 6.40mm/px · 6 of 64 frames shown]
[frame 6/64]
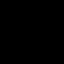
[frame 16/64]
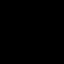
[frame 27/64]
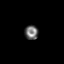
[frame 38/64]
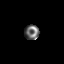
[frame 48/64]
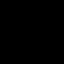
[frame 59/64]
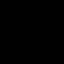

[Series 1: stress-gsp_(id)_sa · 6.4mm · 6.40mm/px · 6 of 512 frames shown]
[frame 43/512]
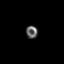
[frame 128/512]
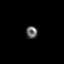
[frame 214/512]
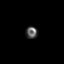
[frame 299/512]
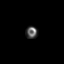
[frame 384/512]
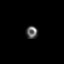
[frame 470/512]
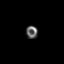

[18 of 18 positions shown; findings below may reference images not displayed]

Canned report from images found in remote index.

Refer to host system for actual result text.

## 2017-02-12 ENCOUNTER — Encounter: Payer: Self-pay | Admitting: Gynecology

## 2017-02-12 ENCOUNTER — Ambulatory Visit (INDEPENDENT_AMBULATORY_CARE_PROVIDER_SITE_OTHER): Payer: BC Managed Care – PPO | Admitting: Gynecology

## 2017-02-12 VITALS — BP 122/78 | Ht 65.0 in | Wt 213.0 lb

## 2017-02-12 DIAGNOSIS — Z01411 Encounter for gynecological examination (general) (routine) with abnormal findings: Secondary | ICD-10-CM

## 2017-02-12 DIAGNOSIS — N952 Postmenopausal atrophic vaginitis: Secondary | ICD-10-CM

## 2017-02-12 NOTE — Patient Instructions (Signed)
Followup in one year for annual exam, sooner if any issues 

## 2017-02-12 NOTE — Progress Notes (Signed)
    Teresa NonesJanice O Love 04-Nov-1955 [id number]        61 y.o.  [id number] for annual exam.  Doing well without GYN complaints  Past medical history,surgical history, problem list, medications, allergies, family history and social history were all reviewed and documented as reviewed in the EPIC chart.  ROS:  Performed with pertinent positives and negatives included in the history, assessment and plan.   Additional significant findings :  None   Exam: Kennon PortelaKim Gardner assistant Vitals:   02/12/17 1356  BP: 122/78  Weight: 213 lb (96.6 kg)  Height: 5\' 5"  (1.651 m)   Body mass index is 35.45 kg/m.  General appearance:  Normal affect, orientation and appearance. Skin: Grossly normal HEENT: Without gross lesions.  No cervical or supraclavicular adenopathy. Thyroid normal.  Lungs:  Clear without wheezing, rales or rhonchi Cardiac: RR, without RMG Abdominal:  Soft, nontender, without masses, guarding, rebound, organomegaly or hernia Breasts:  Examined lying and sitting without masses, retractions, discharge or axillary adenopathy. Pelvic:  Ext, BUS, Vagina: Normal with atrophic changes  Cervix: Normal with atrophic changes  Uterus: Anteverted, normal size, shape and contour, midline and mobile nontender   Adnexa: Without masses or tenderness    Anus and perineum: Normal   Rectovaginal: Normal sphincter tone without palpated masses or tenderness.    Assessment/Plan:  61 y.o. [id number] female for annual exam.  1. Postmenopausal/atrophic genital changes. No significant hot flushes, night sweats, vaginal dryness or any vaginal bleeding. Continue to monitor report any issues or bleeding. 2. Mammography coming due in July and I reminded her to schedule this. SBE monthly reviewed. Breast exam normal today. History of breast cancer in her mother at age 62. 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 She asked if I would check with the genetic counselors to see if there is any new information and  she is going to call me in a week in follow up. 3. Pap smear/HPV 01/2014. No Pap smear done today. History of cryosurgery at age 61. Plan repeat Pap smear at 5 year interval per current screening guidelines. 4. DEXA reported normal 2016 through her primary physician's office. She'll continue to follow up with them in reference to her bone health. 5. Colonoscopy 2017. Repeat at their recommended interval. 6. Health maintenance. No routine lab work done as patient reports this done elsewhere. Follow up 1 year, sooner as needed.   Dara LordsFONTAINE,Hena Ewalt P MD, 2:22 PM 02/12/2017

## 2017-03-15 ENCOUNTER — Encounter: Payer: Self-pay | Admitting: Gynecology

## 2017-05-23 ENCOUNTER — Other Ambulatory Visit: Payer: Self-pay | Admitting: Gynecology

## 2017-07-17 ENCOUNTER — Other Ambulatory Visit: Payer: Self-pay | Admitting: Gynecology

## 2017-07-19 ENCOUNTER — Telehealth: Payer: Self-pay | Admitting: *Deleted

## 2017-07-19 MED ORDER — FLUCONAZOLE 150 MG PO TABS: 150.0000 mg | ORAL_TABLET | Freq: Every day | ORAL | 0 refills | Status: DC

## 2017-07-19 NOTE — Telephone Encounter (Signed)
Left message on voicemail Rx sent.  

## 2017-07-19 NOTE — Telephone Encounter (Signed)
Received request for Diflucan.  Is patient having symptoms?

## 2017-07-19 NOTE — Telephone Encounter (Signed)
Okay for Diflucan 150 mg #2 1 p.o. as needed vaginal discharge

## 2017-07-19 NOTE — Telephone Encounter (Signed)
Patient called c/o yeast infection itching, white discharge. Has been on antibiotic x 2 weeks, from another provider. Pt asked if diflucan 150 # 2 pills can be prescribed?

## 2018-02-14 ENCOUNTER — Ambulatory Visit: Payer: BC Managed Care – PPO | Admitting: Gynecology

## 2018-02-14 ENCOUNTER — Encounter: Payer: Self-pay | Admitting: Gynecology

## 2018-02-14 VITALS — BP 124/80 | Ht 65.0 in | Wt 203.0 lb

## 2018-02-14 DIAGNOSIS — Z8742 Personal history of other diseases of the female genital tract: Secondary | ICD-10-CM

## 2018-02-14 DIAGNOSIS — N952 Postmenopausal atrophic vaginitis: Secondary | ICD-10-CM | POA: Diagnosis not present

## 2018-02-14 DIAGNOSIS — Z01419 Encounter for gynecological examination (general) (routine) without abnormal findings: Secondary | ICD-10-CM | POA: Diagnosis not present

## 2018-02-14 MED ORDER — FLUCONAZOLE 150 MG PO TABS: 150.0000 mg | ORAL_TABLET | ORAL | 0 refills | Status: DC | PRN

## 2018-02-14 NOTE — Patient Instructions (Signed)
Follow-up in 1 year, sooner as needed. 

## 2018-02-14 NOTE — Progress Notes (Signed)
    Tanya NonesJanice O Rod [name] 13, 1956 [id number]        62 y.o.  [id number] for annual gynecologic exam.  Doing well without gynecologic complaints.  Past medical history,surgical history, problem list, medications, allergies, family history and social history were all reviewed and documented as reviewed in the EPIC chart.  ROS:  Performed with pertinent positives and negatives included in the history, assessment and plan.   Additional significant findings : None   Exam: Kennon PortelaKim Gardner assistant Vitals:   02/14/18 0913  BP: 124/80  Weight: 203 lb (92.1 kg)  Height: 5\' 5"  (1.651 m)   Body mass index is 33.78 kg/m.  General appearance:  Normal affect, orientation and appearance. Skin: Grossly normal HEENT: Without gross lesions.  No cervical or supraclavicular adenopathy. Thyroid normal.  Lungs:  Clear without wheezing, rales or rhonchi Cardiac: RR, without RMG Abdominal:  Soft, nontender, without masses, guarding, rebound, organomegaly or hernia Breasts:  Examined lying and sitting without masses, retractions, discharge or axillary adenopathy. Pelvic:  Ext, BUS, Vagina: Normal with atrophic changes  Cervix: Normal with atrophic changes  Uterus: Anteverted, normal size, shape and contour, midline and mobile nontender   Adnexa: Without masses or tenderness    Anus and perineum: Normal   Rectovaginal: Normal sphincter tone without palpated masses or tenderness.    Assessment/Plan:  62 y.o. [id number] female for annual gynecologic exam.   1. Postmenopausal/atrophic genital changes.  No significant menopausal symptoms or any vaginal bleeding. 2. Mammography scheduled and she will follow-up for this.  Breast exam normal today.  History of breast cancer in her mother at age 63.  The patient was genetically tested and was negative for known mutations.  Did have a variance of unknown significance. 3. Pap smear/HPV 01/2014.  No Pap smear done today.  Plan repeat Pap smear/HPV next year at 5-year  interval per current screening guidelines.  History of cryosurgery at age 62. 4. Colonoscopy 2017.  Repeat at their recommended interval. 5. DEXA reported 2016 normal.  Repeat at primary physician's recommended interval as this is where she has it done. 6. History of occasional recurrent yeast infections.  Diflucan 150 mg #6 provided to use as needed. 7. Health maintenance.  No routine lab work done as patient does this elsewhere.  Follow-up 1 year, sooner as needed.   Dara Lordsimothy P Pura Picinich MD, 9:47 AM 02/14/2018

## 2018-03-16 ENCOUNTER — Encounter: Payer: Self-pay | Admitting: Gynecology

## 2018-03-18 ENCOUNTER — Encounter: Payer: Self-pay | Admitting: Gynecology

## 2019-02-15 ENCOUNTER — Other Ambulatory Visit: Payer: Self-pay

## 2019-02-16 ENCOUNTER — Encounter: Payer: Self-pay | Admitting: Gynecology

## 2019-02-16 ENCOUNTER — Ambulatory Visit: Payer: BC Managed Care – PPO | Admitting: Gynecology

## 2019-02-16 ENCOUNTER — Other Ambulatory Visit: Payer: Self-pay

## 2019-02-16 VITALS — BP 124/80 | Ht 65.0 in | Wt 207.0 lb

## 2019-02-16 DIAGNOSIS — K640 First degree hemorrhoids: Secondary | ICD-10-CM

## 2019-02-16 DIAGNOSIS — Z01419 Encounter for gynecological examination (general) (routine) without abnormal findings: Secondary | ICD-10-CM | POA: Diagnosis not present

## 2019-02-16 DIAGNOSIS — Z1151 Encounter for screening for human papillomavirus (HPV): Secondary | ICD-10-CM

## 2019-02-16 DIAGNOSIS — N952 Postmenopausal atrophic vaginitis: Secondary | ICD-10-CM

## 2019-02-16 MED ORDER — PRAMOXINE-HC 1-1 % EX CREA: TOPICAL_CREAM | Freq: Three times a day (TID) | CUTANEOUS | 1 refills | Status: DC

## 2019-02-16 MED ORDER — FLUCONAZOLE 150 MG PO TABS: 150.0000 mg | ORAL_TABLET | ORAL | 1 refills | Status: AC | PRN

## 2019-02-16 NOTE — Addendum Note (Signed)
Addended by: Nelva Nay on: 02/16/2019 10:13 AM   Modules accepted: Orders

## 2019-02-16 NOTE — Progress Notes (Signed)
    Teresa Love 1955-11-21 [id number]        62 y.o.  [id number] for annual gynecologic exam.  Without gynecologic complaints.  Past medical history,surgical history, problem list, medications, allergies, family history and social history were all reviewed and documented as reviewed in the EPIC chart.  ROS:  Performed with pertinent positives and negatives included in the history, assessment and plan.   Additional significant findings : None   Exam: Caryn Bee assistant Vitals:   02/16/19 0929  BP: 124/80  Weight: 207 lb (93.9 kg)  Height: 5\' 5"  (1.651 m)   Body mass index is 34.45 kg/m.  General appearance:  Normal affect, orientation and appearance. Skin: Grossly normal HEENT: Without gross lesions.  No cervical or supraclavicular adenopathy. Thyroid normal.  Lungs:  Clear without wheezing, rales or rhonchi Cardiac: RR, without RMG Abdominal:  Soft, nontender, without masses, guarding, rebound, organomegaly or hernia Breasts:  Examined lying and sitting without masses, retractions, discharge or axillary adenopathy.  Well-healed bilateral reduction scars. Pelvic:  Ext, BUS, Vagina: With atrophic changes  Cervix: With atrophic changes.  Pap smear/HPV  Uterus: Anteverted, normal size, shape and contour, midline and mobile nontender   Adnexa: Without masses or tenderness    Anus and perineum: Normal   Rectovaginal: Normal sphincter tone without palpated masses or tenderness.    Assessment/Plan:  63 y.o. [id number] female for annual gynecologic exam.   1. Postmenopausal/atrophic genital changes.  Without significant menopausal symptoms.  No vaginal bleeding. 2. Mammography 2019.  In the process of arranging mammography.  Breast exam normal today. 3. Pap smear/HPV 2015.  Pap smear/HPV today.  History of cryosurgery at age 66 with normal Pap smears since. 4. Colonoscopy 2017.  Repeat at their recommended interval. 5. DEXA 2016 reported normal.  Is followed by her primary  physician for this.  I did recommend repeat DEXA at age 62. 62. History of recurrent yeast infections.  Uses Diflucan 150 mg as needed.  #10 with 1 refill provided. 7. Uses Analpram equivalent for hemorrhoids.  Does well with this.  Refill provided. 8. Health maintenance.  No routine lab work done as patient does this elsewhere.  Follow-up 1 year, sooner as needed.   Anastasio Auerbach MD, 9:58 AM 02/16/2019

## 2019-02-16 NOTE — Patient Instructions (Signed)
Follow-up in 1 year for annual exam, sooner as needed. 

## 2019-02-17 LAB — PAP IG AND HPV HIGH-RISK: HPV DNA High Risk: NOT DETECTED

## 2019-03-22 ENCOUNTER — Encounter: Payer: Self-pay | Admitting: Gynecology

## 2019-05-23 ENCOUNTER — Encounter: Payer: Self-pay | Admitting: Gynecology

## 2020-02-16 ENCOUNTER — Ambulatory Visit: Payer: BC Managed Care – PPO | Admitting: Gynecology

## 2020-02-19 ENCOUNTER — Ambulatory Visit: Payer: BC Managed Care – PPO | Admitting: Gynecology

## 2020-06-24 DEATH — deceased

## 2021-02-27 ENCOUNTER — Ambulatory Visit
Admission: RE | Admit: 2021-02-27 | Discharge: 2021-02-27 | Disposition: A | Discharge status: Home / Self Care | Payer: Medicare PPO | Source: Ambulatory Visit | Attending: Physician Assistant | Admitting: Physician Assistant

## 2021-02-27 ENCOUNTER — Other Ambulatory Visit: Payer: Self-pay | Admitting: Physician Assistant

## 2021-02-27 DIAGNOSIS — R052 Subacute cough: Secondary | ICD-10-CM

## 2022-02-12 DIAGNOSIS — M8580 Other specified disorders of bone density and structure, unspecified site: Secondary | ICD-10-CM | POA: Diagnosis not present

## 2022-02-12 DIAGNOSIS — R6882 Decreased libido: Secondary | ICD-10-CM | POA: Diagnosis not present

## 2022-02-12 DIAGNOSIS — Z01419 Encounter for gynecological examination (general) (routine) without abnormal findings: Secondary | ICD-10-CM | POA: Diagnosis not present

## 2022-04-10 DIAGNOSIS — Z1231 Encounter for screening mammogram for malignant neoplasm of breast: Secondary | ICD-10-CM | POA: Diagnosis not present

## 2022-04-17 DIAGNOSIS — R921 Mammographic calcification found on diagnostic imaging of breast: Secondary | ICD-10-CM | POA: Diagnosis not present

## 2022-06-16 DIAGNOSIS — M25511 Pain in right shoulder: Secondary | ICD-10-CM | POA: Diagnosis not present

## 2022-06-17 DIAGNOSIS — L821 Other seborrheic keratosis: Secondary | ICD-10-CM | POA: Diagnosis not present

## 2022-06-17 DIAGNOSIS — L603 Nail dystrophy: Secondary | ICD-10-CM | POA: Diagnosis not present

## 2022-06-17 DIAGNOSIS — Z85828 Personal history of other malignant neoplasm of skin: Secondary | ICD-10-CM | POA: Diagnosis not present

## 2022-06-17 DIAGNOSIS — D1801 Hemangioma of skin and subcutaneous tissue: Secondary | ICD-10-CM | POA: Diagnosis not present

## 2022-06-17 DIAGNOSIS — D485 Neoplasm of uncertain behavior of skin: Secondary | ICD-10-CM | POA: Diagnosis not present

## 2022-06-17 DIAGNOSIS — L814 Other melanin hyperpigmentation: Secondary | ICD-10-CM | POA: Diagnosis not present

## 2022-06-17 DIAGNOSIS — L820 Inflamed seborrheic keratosis: Secondary | ICD-10-CM | POA: Diagnosis not present

## 2022-06-17 DIAGNOSIS — C4402 Squamous cell carcinoma of skin of lip: Secondary | ICD-10-CM | POA: Diagnosis not present

## 2022-06-17 DIAGNOSIS — L918 Other hypertrophic disorders of the skin: Secondary | ICD-10-CM | POA: Diagnosis not present

## 2022-06-22 DIAGNOSIS — H25041 Posterior subcapsular polar age-related cataract, right eye: Secondary | ICD-10-CM | POA: Diagnosis not present

## 2022-06-22 DIAGNOSIS — H5213 Myopia, bilateral: Secondary | ICD-10-CM | POA: Diagnosis not present

## 2022-06-24 DIAGNOSIS — Z0000 Encounter for general adult medical examination without abnormal findings: Secondary | ICD-10-CM | POA: Diagnosis not present

## 2022-06-24 DIAGNOSIS — E782 Mixed hyperlipidemia: Secondary | ICD-10-CM | POA: Diagnosis not present

## 2022-06-24 DIAGNOSIS — F331 Major depressive disorder, recurrent, moderate: Secondary | ICD-10-CM | POA: Diagnosis not present

## 2022-06-24 DIAGNOSIS — F419 Anxiety disorder, unspecified: Secondary | ICD-10-CM | POA: Diagnosis not present

## 2022-06-24 DIAGNOSIS — J309 Allergic rhinitis, unspecified: Secondary | ICD-10-CM | POA: Diagnosis not present

## 2022-06-24 DIAGNOSIS — G4700 Insomnia, unspecified: Secondary | ICD-10-CM | POA: Diagnosis not present

## 2022-06-24 DIAGNOSIS — Z23 Encounter for immunization: Secondary | ICD-10-CM | POA: Diagnosis not present

## 2022-06-24 DIAGNOSIS — K2100 Gastro-esophageal reflux disease with esophagitis, without bleeding: Secondary | ICD-10-CM | POA: Diagnosis not present

## 2022-06-24 DIAGNOSIS — E039 Hypothyroidism, unspecified: Secondary | ICD-10-CM | POA: Diagnosis not present

## 2022-08-03 DIAGNOSIS — E039 Hypothyroidism, unspecified: Secondary | ICD-10-CM | POA: Diagnosis not present

## 2022-08-06 DIAGNOSIS — C4402 Squamous cell carcinoma of skin of lip: Secondary | ICD-10-CM | POA: Diagnosis not present

## 2022-08-06 DIAGNOSIS — Z85828 Personal history of other malignant neoplasm of skin: Secondary | ICD-10-CM | POA: Diagnosis not present

## 2022-08-19 DIAGNOSIS — R519 Headache, unspecified: Secondary | ICD-10-CM | POA: Diagnosis not present

## 2022-08-19 DIAGNOSIS — R059 Cough, unspecified: Secondary | ICD-10-CM | POA: Diagnosis not present

## 2022-08-19 DIAGNOSIS — R5383 Other fatigue: Secondary | ICD-10-CM | POA: Diagnosis not present

## 2022-08-19 DIAGNOSIS — Z20822 Contact with and (suspected) exposure to covid-19: Secondary | ICD-10-CM | POA: Diagnosis not present

## 2022-08-20 DIAGNOSIS — R519 Headache, unspecified: Secondary | ICD-10-CM | POA: Diagnosis not present

## 2022-08-20 DIAGNOSIS — R059 Cough, unspecified: Secondary | ICD-10-CM | POA: Diagnosis not present

## 2022-08-20 DIAGNOSIS — R5383 Other fatigue: Secondary | ICD-10-CM | POA: Diagnosis not present

## 2022-08-20 DIAGNOSIS — Z20822 Contact with and (suspected) exposure to covid-19: Secondary | ICD-10-CM | POA: Diagnosis not present

## 2022-10-14 DIAGNOSIS — N958 Other specified menopausal and perimenopausal disorders: Secondary | ICD-10-CM | POA: Diagnosis not present

## 2022-10-17 DIAGNOSIS — Z20822 Contact with and (suspected) exposure to covid-19: Secondary | ICD-10-CM | POA: Diagnosis not present

## 2022-10-17 DIAGNOSIS — R5383 Other fatigue: Secondary | ICD-10-CM | POA: Diagnosis not present

## 2022-10-17 DIAGNOSIS — R519 Headache, unspecified: Secondary | ICD-10-CM | POA: Diagnosis not present

## 2022-10-17 DIAGNOSIS — R059 Cough, unspecified: Secondary | ICD-10-CM | POA: Diagnosis not present

## 2022-10-19 DIAGNOSIS — R059 Cough, unspecified: Secondary | ICD-10-CM | POA: Diagnosis not present

## 2022-10-19 DIAGNOSIS — R5383 Other fatigue: Secondary | ICD-10-CM | POA: Diagnosis not present

## 2022-10-19 DIAGNOSIS — R519 Headache, unspecified: Secondary | ICD-10-CM | POA: Diagnosis not present

## 2022-10-19 DIAGNOSIS — Z20822 Contact with and (suspected) exposure to covid-19: Secondary | ICD-10-CM | POA: Diagnosis not present

## 2022-10-21 DIAGNOSIS — Z20822 Contact with and (suspected) exposure to covid-19: Secondary | ICD-10-CM | POA: Diagnosis not present

## 2022-10-21 DIAGNOSIS — R059 Cough, unspecified: Secondary | ICD-10-CM | POA: Diagnosis not present

## 2022-10-21 DIAGNOSIS — R5383 Other fatigue: Secondary | ICD-10-CM | POA: Diagnosis not present

## 2022-10-21 DIAGNOSIS — R519 Headache, unspecified: Secondary | ICD-10-CM | POA: Diagnosis not present

## 2022-10-23 DIAGNOSIS — Z20822 Contact with and (suspected) exposure to covid-19: Secondary | ICD-10-CM | POA: Diagnosis not present

## 2022-10-23 DIAGNOSIS — R5383 Other fatigue: Secondary | ICD-10-CM | POA: Diagnosis not present

## 2022-10-23 DIAGNOSIS — R059 Cough, unspecified: Secondary | ICD-10-CM | POA: Diagnosis not present

## 2022-10-23 DIAGNOSIS — R519 Headache, unspecified: Secondary | ICD-10-CM | POA: Diagnosis not present

## 2022-10-26 DIAGNOSIS — R5383 Other fatigue: Secondary | ICD-10-CM | POA: Diagnosis not present

## 2022-10-26 DIAGNOSIS — R519 Headache, unspecified: Secondary | ICD-10-CM | POA: Diagnosis not present

## 2022-10-26 DIAGNOSIS — Z20822 Contact with and (suspected) exposure to covid-19: Secondary | ICD-10-CM | POA: Diagnosis not present

## 2022-10-26 DIAGNOSIS — R059 Cough, unspecified: Secondary | ICD-10-CM | POA: Diagnosis not present

## 2022-10-28 DIAGNOSIS — N390 Urinary tract infection, site not specified: Secondary | ICD-10-CM | POA: Diagnosis not present

## 2022-11-05 DIAGNOSIS — Z03818 Encounter for observation for suspected exposure to other biological agents ruled out: Secondary | ICD-10-CM | POA: Diagnosis not present

## 2022-11-05 DIAGNOSIS — R6883 Chills (without fever): Secondary | ICD-10-CM | POA: Diagnosis not present

## 2022-11-05 DIAGNOSIS — R519 Headache, unspecified: Secondary | ICD-10-CM | POA: Diagnosis not present

## 2022-11-07 DIAGNOSIS — R059 Cough, unspecified: Secondary | ICD-10-CM | POA: Diagnosis not present

## 2022-11-07 DIAGNOSIS — R5383 Other fatigue: Secondary | ICD-10-CM | POA: Diagnosis not present

## 2022-11-07 DIAGNOSIS — Z20822 Contact with and (suspected) exposure to covid-19: Secondary | ICD-10-CM | POA: Diagnosis not present

## 2022-11-07 DIAGNOSIS — R519 Headache, unspecified: Secondary | ICD-10-CM | POA: Diagnosis not present

## 2022-11-09 DIAGNOSIS — Z20822 Contact with and (suspected) exposure to covid-19: Secondary | ICD-10-CM | POA: Diagnosis not present

## 2022-11-09 DIAGNOSIS — R059 Cough, unspecified: Secondary | ICD-10-CM | POA: Diagnosis not present

## 2022-11-09 DIAGNOSIS — R519 Headache, unspecified: Secondary | ICD-10-CM | POA: Diagnosis not present

## 2022-11-09 DIAGNOSIS — R5383 Other fatigue: Secondary | ICD-10-CM | POA: Diagnosis not present

## 2022-12-23 DIAGNOSIS — F331 Major depressive disorder, recurrent, moderate: Secondary | ICD-10-CM | POA: Diagnosis not present

## 2022-12-23 DIAGNOSIS — K219 Gastro-esophageal reflux disease without esophagitis: Secondary | ICD-10-CM | POA: Diagnosis not present

## 2022-12-23 DIAGNOSIS — E039 Hypothyroidism, unspecified: Secondary | ICD-10-CM | POA: Diagnosis not present

## 2022-12-23 DIAGNOSIS — F419 Anxiety disorder, unspecified: Secondary | ICD-10-CM | POA: Diagnosis not present

## 2022-12-23 DIAGNOSIS — G4700 Insomnia, unspecified: Secondary | ICD-10-CM | POA: Diagnosis not present

## 2022-12-23 DIAGNOSIS — E782 Mixed hyperlipidemia: Secondary | ICD-10-CM | POA: Diagnosis not present

## 2023-04-25 DIAGNOSIS — C801 Malignant (primary) neoplasm, unspecified: Secondary | ICD-10-CM

## 2023-04-25 HISTORY — DX: Malignant (primary) neoplasm, unspecified: C80.1

## 2023-04-30 DIAGNOSIS — N958 Other specified menopausal and perimenopausal disorders: Secondary | ICD-10-CM | POA: Diagnosis not present

## 2023-04-30 DIAGNOSIS — M8588 Other specified disorders of bone density and structure, other site: Secondary | ICD-10-CM | POA: Diagnosis not present

## 2023-04-30 DIAGNOSIS — Z1231 Encounter for screening mammogram for malignant neoplasm of breast: Secondary | ICD-10-CM | POA: Diagnosis not present

## 2023-05-04 DIAGNOSIS — N6489 Other specified disorders of breast: Secondary | ICD-10-CM | POA: Diagnosis not present

## 2023-05-04 DIAGNOSIS — R928 Other abnormal and inconclusive findings on diagnostic imaging of breast: Secondary | ICD-10-CM | POA: Diagnosis not present

## 2023-05-10 DIAGNOSIS — Z01818 Encounter for other preprocedural examination: Secondary | ICD-10-CM | POA: Diagnosis not present

## 2023-05-10 DIAGNOSIS — R922 Inconclusive mammogram: Secondary | ICD-10-CM | POA: Diagnosis not present

## 2023-05-12 ENCOUNTER — Other Ambulatory Visit: Payer: Self-pay

## 2023-05-12 DIAGNOSIS — D0512 Intraductal carcinoma in situ of left breast: Secondary | ICD-10-CM | POA: Diagnosis not present

## 2023-05-12 DIAGNOSIS — R928 Other abnormal and inconclusive findings on diagnostic imaging of breast: Secondary | ICD-10-CM | POA: Diagnosis not present

## 2023-05-12 DIAGNOSIS — C50412 Malignant neoplasm of upper-outer quadrant of left female breast: Secondary | ICD-10-CM | POA: Diagnosis not present

## 2023-05-13 LAB — SURGICAL PATHOLOGY

## 2023-05-14 ENCOUNTER — Telehealth: Payer: Self-pay | Admitting: Hematology and Oncology

## 2023-05-14 NOTE — Telephone Encounter (Signed)
Spoke to patient to confirm upcoming afternoon [facility] clinic appointment on 9/25, paperwork will be sent via e-mail.   Gave location and time, also informed patient that the surgeon's office would be calling as well to get information from them similar to the packet that they will be receiving so make sure to do both.  Reminded patient that all providers will be coming to the clinic to see them HERE and if they had any questions to not hesitate to reach back out to myself or their navigators.

## 2023-05-18 ENCOUNTER — Encounter: Payer: Self-pay | Admitting: *Deleted

## 2023-05-18 ENCOUNTER — Encounter: Payer: Self-pay | Admitting: Genetic Counselor

## 2023-05-18 DIAGNOSIS — D0512 Intraductal carcinoma in situ of left breast: Secondary | ICD-10-CM | POA: Insufficient documentation

## 2023-05-18 DIAGNOSIS — Z1379 Encounter for other screening for genetic and chromosomal anomalies: Secondary | ICD-10-CM | POA: Insufficient documentation

## 2023-05-19 ENCOUNTER — Encounter: Payer: Self-pay | Admitting: *Deleted

## 2023-05-19 ENCOUNTER — Encounter: Payer: Self-pay | Admitting: Genetic Counselor

## 2023-05-19 ENCOUNTER — Other Ambulatory Visit (HOSPITAL_COMMUNITY): Payer: Self-pay

## 2023-05-19 ENCOUNTER — Inpatient Hospital Stay: Payer: Medicare PPO | Admitting: Hematology and Oncology

## 2023-05-19 ENCOUNTER — Ambulatory Visit: Payer: Medicare PPO | Admitting: Physical Therapy

## 2023-05-19 ENCOUNTER — Inpatient Hospital Stay: Payer: Medicare PPO | Admitting: Genetic Counselor

## 2023-05-19 ENCOUNTER — Ambulatory Visit
Admission: RE | Admit: 2023-05-19 | Discharge: 2023-05-19 | Disposition: A | Discharge status: Home / Self Care | Payer: Medicare PPO | Source: Ambulatory Visit | Attending: Radiation Oncology | Admitting: Radiation Oncology

## 2023-05-19 ENCOUNTER — Ambulatory Visit: Payer: Self-pay | Admitting: General Surgery

## 2023-05-19 ENCOUNTER — Encounter: Payer: Self-pay | Admitting: General Practice

## 2023-05-19 ENCOUNTER — Inpatient Hospital Stay: Payer: Medicare PPO | Attending: Hematology and Oncology

## 2023-05-19 VITALS — BP 146/80 | HR 67 | Temp 97.7°F | Resp 18 | Ht 65.0 in | Wt 167.0 lb

## 2023-05-19 DIAGNOSIS — C50412 Malignant neoplasm of upper-outer quadrant of left female breast: Secondary | ICD-10-CM | POA: Insufficient documentation

## 2023-05-19 DIAGNOSIS — Z803 Family history of malignant neoplasm of breast: Secondary | ICD-10-CM | POA: Insufficient documentation

## 2023-05-19 DIAGNOSIS — Z170 Estrogen receptor positive status [ER+]: Secondary | ICD-10-CM

## 2023-05-19 DIAGNOSIS — D0512 Intraductal carcinoma in situ of left breast: Secondary | ICD-10-CM

## 2023-05-19 DIAGNOSIS — Z1379 Encounter for other screening for genetic and chromosomal anomalies: Secondary | ICD-10-CM

## 2023-05-19 LAB — CBC WITH DIFFERENTIAL (CANCER CENTER ONLY)
Abs Immature Granulocytes: 0.02 10*3/uL (ref 0.00–0.07)
Basophils Absolute: 0.1 10*3/uL (ref 0.0–0.1)
Basophils Relative: 1 %
Eosinophils Absolute: 0.2 10*3/uL (ref 0.0–0.5)
Eosinophils Relative: 3 %
HCT: 40.8 % (ref 36.0–46.0)
Hemoglobin: 13.4 g/dL (ref 12.0–15.0)
Immature Granulocytes: 0 %
Lymphocytes Relative: 35 %
Lymphs Abs: 2 10*3/uL (ref 0.7–4.0)
MCH: 30.3 pg (ref 26.0–34.0)
MCHC: 32.8 g/dL (ref 30.0–36.0)
MCV: 92.3 fL (ref 80.0–100.0)
Monocytes Absolute: 0.5 10*3/uL (ref 0.1–1.0)
Monocytes Relative: 8 %
Neutro Abs: 3.1 10*3/uL (ref 1.7–7.7)
Neutrophils Relative %: 53 %
Platelet Count: 282 10*3/uL (ref 150–400)
RBC: 4.42 MIL/uL (ref 3.87–5.11)
RDW: 12.5 % (ref 11.5–15.5)
WBC Count: 5.7 10*3/uL (ref 4.0–10.5)
nRBC: 0 % (ref 0.0–0.2)

## 2023-05-19 LAB — CMP (CANCER CENTER ONLY)
ALT: 10 U/L (ref 0–44)
AST: 14 U/L — ABNORMAL LOW (ref 15–41)
Albumin: 4.3 g/dL (ref 3.5–5.0)
Alkaline Phosphatase: 83 U/L (ref 38–126)
Anion gap: 5 (ref 5–15)
BUN: 14 mg/dL (ref 8–23)
CO2: 27 mmol/L (ref 22–32)
Calcium: 9.5 mg/dL (ref 8.9–10.3)
Chloride: 105 mmol/L (ref 98–111)
Creatinine: 0.83 mg/dL (ref 0.44–1.00)
GFR, Estimated: >60 mL/min (ref >60)
Glucose, Bld: 86 mg/dL (ref 70–99)
Potassium: 4.2 mmol/L (ref 3.5–5.1)
Sodium: 137 mmol/L (ref 135–145)
Total Bilirubin: 0.5 mg/dL (ref 0.3–1.2)
Total Protein: 7.1 g/dL (ref 6.5–8.1)

## 2023-05-19 LAB — GENETIC SCREENING ORDER

## 2023-05-19 MED ORDER — FLUAD 0.5 ML IM SUSY
PREFILLED_SYRINGE | INTRAMUSCULAR | 0 refills | Status: DC
  Filled 2023-05-19: qty 0.5, 1d supply, fill #0

## 2023-05-19 MED ORDER — KETOROLAC TROMETHAMINE 15 MG/ML IJ SOLN: 15.0000 mg | INTRAMUSCULAR | Status: AC

## 2023-05-19 NOTE — Progress Notes (Signed)
Radiation Oncology         [phone] ________________________________  Multidisciplinary Breast Oncology Clinic [facility]) Initial Outpatient Consultation  Name: Teresa Love MRN: [id number]  Date: 05/19/2023  DOB: [birth year]  [id number], Teresa Scott, MD  Griselda Miner, MD   REFERRING PHYSICIAN: Chevis Pretty III, MD  DIAGNOSIS: The encounter diagnosis was Malignant neoplasm of upper-outer quadrant of left breast in female, estrogen receptor positive (HCC).  Stage IA (cT1b, cN0, cM0) Left Breast UOQ, Invasive Ductal Carcinoma, ER+ / PR+ / Her2-, Grade 1    ICD-10-CM   1. Malignant neoplasm of upper-outer quadrant of left breast in female, estrogen receptor positive (HCC)  C50.412    Z17.0       HISTORY OF PRESENT ILLNESS::Teresa Love is a 67 y.o. female who is presenting to the office today for evaluation of her newly diagnosed breast cancer. She is accompanied by her husband and son. She is doing well overall.   She had routine screening mammography on 04/30/23 showing a possible abnormality in the left breast. She underwent left breast diagnostic mammography with tomography and left breast ultrasonography at [facility] on 05/04/23 showing: a 0.8 cm mass in the 1 o'clock left breast located 6 cmfn. Physical exam performed at the time of this study noted a small 1 cm palpable oval area in the upper outer left breast .  Biopsy of the 1 o'clock left breast on 05/12/23 showed: grade 1 invasive ductal carcinoma  measuring 8 mm in the greatest linear extent of the sample . Prognostic indicators significant for: estrogen receptor, 100% positive and progesterone receptor, 100% positive, both with strong staining intensity. Proliferation marker Ki67 at 2%. HER2 negative.  Menarche: 67 years old Age at first live birth: 67 years old GP: 2 LMP: at 67 years old  Contraceptive: yes, BC pills for 27 years HRT: not indicated on the provided form   The patient was referred today for presentation in the  multidisciplinary conference.  Radiology studies and pathology slides were presented there for review and discussion of treatment options.  A consensus was discussed regarding potential next steps.  PREVIOUS RADIATION THERAPY: No  PAST MEDICAL HISTORY:  Past Medical History:  Diagnosis Date   Depression    GERD (gastroesophageal reflux disease)    occ, not a problem   Hemorrhoids, internal, with bleeding    Hyperlipidemia    Hypothyroidism     PAST SURGICAL HISTORY: Past Surgical History:  Procedure Laterality Date   APPENDECTOMY  1975   BREAST SURGERY     Reduction   CARDIAC CATHETERIZATION N/A 10/07/2015   Procedure: Left Heart Cath and Coronary Angiography;  Surgeon: Lyn Records, MD;  Location: [facility] INVASIVE CV LAB;  Service: Cardiovascular;  Laterality: N/A;   COLONOSCOPY WITH PROPOFOL N/A 06/23/2016   Procedure: COLONOSCOPY WITH PROPOFOL;  Surgeon: Charolett Bumpers, MD;  Location: [facility] ENDOSCOPY;  Service: Endoscopy;  Laterality: N/A;   GYNECOLOGIC CRYOSURGERY  age 34    FAMILY HISTORY:  Family History  Problem Relation Age of Onset   Hypertension Mother    Breast cancer Mother 67   Kidney cancer Mother 65   Heart disease Father    Hypertension Father    Breast cancer Paternal Aunt 1   Tuberculosis Maternal Grandmother     SOCIAL HISTORY:  Social History   Socioeconomic History   Marital status: Married    Spouse name: Not on file   Number of children: 2   Years of education: Not on file  Highest education level: Not on file  Occupational History    Employer: RETIRED  Tobacco Use   Smoking status: Never   Smokeless tobacco: Never  Vaping Use   Vaping status: Never Used  Substance and Sexual Activity   Alcohol use: Yes    Alcohol/week: 1.0 standard drink of alcohol    Types: 1 Standard drinks or equivalent per week   Drug use: No   Sexual activity: Not Currently    Birth control/protection: Post-menopausal    Comment: 1st intercourse 12 yo-1 partner   Other Topics Concern   Not on file  Social History Narrative   Not on file   Social Determinants of Health   Financial Resource Strain: Not on file  Food Insecurity: Not on file  Transportation Needs: Not on file  Physical Activity: Not on file  Stress: Not on file  Social Connections: Not on file    ALLERGIES:  Allergies  Allergen Reactions   Amoxicillin-Pot Clavulanate     Other Reaction(s): Unknown    MEDICATIONS:  Current Outpatient Medications  Medication Sig Dispense Refill   ALPRAZolam (XANAX) 0.5 MG tablet Take 0.5 mg by mouth 2 (two) times daily as needed for anxiety or sleep.     Biotin 5000 MCG CAPS Take 1 capsule by mouth every other day.     citalopram (CELEXA) 20 MG tablet Take 20 mg by mouth daily.      fluconazole (DIFLUCAN) 150 MG tablet Take 1 tablet (150 mg total) by mouth as needed. For yeast 10 tablet 1   hydrocortisone-pramoxine (ANALPRAM-HC) 2.5-1 % rectal cream Place 1 application rectally 3 (three) times daily. (Patient not taking: Reported on 02/16/2019) 30 g 0   levothyroxine (SYNTHROID) 100 MCG tablet Take 100 mcg by mouth daily.     LORazepam (ATIVAN) 1 MG tablet Take 1 mg by mouth as needed for anxiety.     Niacin (VITAMIN B-3 PO) Take 2,000 Int'l Units/L by mouth daily.     pantoprazole (PROTONIX) 20 MG tablet Take 20 mg by mouth daily.     pravastatin (PRAVACHOL) 40 MG tablet Take 40 mg by mouth daily. Takes at bedtime     PREMARIN vaginal cream Place 1 applicator vaginally 2 (two) times a week.     Semaglutide-Weight Management 2.4 MG/0.75ML SOAJ Inject 2.4 mg into the skin once a week.     No current facility-administered medications for this encounter.   Facility-Administered Medications Ordered in Other Encounters  Medication Dose Route Frequency Provider Last Rate Last Admin   [START ON 05/20/2023] ketorolac (TORADOL) 15 MG/ML injection 15 mg  15 mg Intravenous On Call to OR Griselda Miner, MD        REVIEW OF SYSTEMS: A 10+ POINT  REVIEW OF SYSTEMS WAS OBTAINED including neurology, dermatology, psychiatry, cardiac, respiratory, lymph, extremities, GI, GU, musculoskeletal, constitutional, reproductive, HEENT. On the provided form, she reports weight changes, wearing glasses, wearing contacts, skin cancer, gout (one time in 1993), anxiety, depression, and thyroid problems. She denies any other symptoms.    PHYSICAL EXAM:     05/19/2023  Vitals with BMI   Height 5\' 5"    Weight 167 lbs   BMI 27.79   Systolic 146 !   Diastolic 80 !   Pulse 67     Legend: ! Abnormal  Lungs are clear to auscultation bilaterally. Heart has regular rate and rhythm. No palpable cervical, supraclavicular, or axillary adenopathy. Abdomen soft, non-tender, normal bowel sounds. Breast: Right breast with no palpable mass,  nipple discharge, or bleeding. Left breast with biopsy site noted in the upper outer quadrant with some induration inferiorly. No nipple discharge or bleeding appreciated.  Scars noted in both lower breasts from her prior breast reduction  KPS = 100  100 - Normal; no complaints; no evidence of disease. 90   - Able to carry on normal activity; minor signs or symptoms of disease. 80   - Normal activity with effort; some signs or symptoms of disease. 27   - Cares for self; unable to carry on normal activity or to do active work. 60   - Requires occasional assistance, but is able to care for most of his personal needs. 50   - Requires considerable assistance and frequent medical care. 40   - Disabled; requires special care and assistance. 30   - Severely disabled; hospital admission is indicated although death not imminent. 20   - Very sick; hospital admission necessary; active supportive treatment necessary. 10   - Moribund; fatal processes progressing rapidly. 0     - Dead  Karnofsky DA, Abelmann WH, Craver LS and Burchenal [facility] [phone]) The use of the nitrogen mustards in the palliative treatment of carcinoma: with particular  reference to bronchogenic carcinoma Cancer 1 634-56  LABORATORY DATA:  Lab Results  Component Value Date   WBC 5.7 05/19/2023   HGB 13.4 05/19/2023   HCT 40.8 05/19/2023   MCV 92.3 05/19/2023   PLT 282 05/19/2023   Lab Results  Component Value Date   NA 137 05/19/2023   K 4.2 05/19/2023   CL 105 05/19/2023   CO2 27 05/19/2023   Lab Results  Component Value Date   ALT 10 05/19/2023   AST 14 (L) 05/19/2023   ALKPHOS 83 05/19/2023   BILITOT 0.5 05/19/2023    PULMONARY FUNCTION TEST:   Review Flowsheet        No data to display          RADIOGRAPHY: No results found.    IMPRESSION: Stage IA (cT1b, cN0, cM0) Left Breast UOQ, Invasive Ductal Carcinoma, ER+ / PR+ / Her2-, Grade 1  Patient will be a good candidate for breast conservation with radiotherapy to the left breast. We discussed the general course of radiation, potential side effects, and toxicities with radiation and the patient is interested in this approach.    PLAN:  Left breast lumpectomy with possible SLN biopsies  Oncotype Dx depending on final tumor size  Adjuvant radiation therapy  Aromatase inhibitor    ------------------------------------------------  Billie Lade, PhD, MD  This document serves as a record of services personally performed by Antony Blackbird, MD. It was created on his behalf by Neena Rhymes, a trained medical scribe. The creation of this record is based on the scribe's personal observations and the provider's statements to them. This document has been checked and approved by the attending provider.

## 2023-05-19 NOTE — Progress Notes (Signed)
REFERRING PROVIDER: Serena Croissant, MD [address] [place],  [place] [address]  PRIMARY PROVIDER:  Georgann Housekeeper, MD  PRIMARY REASON FOR VISIT:  1. Malignant neoplasm of upper-outer quadrant of left breast in female, estrogen receptor positive (HCC)   2. Family history of breast cancer   3. Genetic testing     HISTORY OF PRESENT ILLNESS:   Ms. Wehrs, a 67 y.o. female, was seen for a [place] cancer genetics consultation at the request of Dr. Pamelia Hoit due to a personal and family history of breast cancer.  Ms. Creque presents to clinic today to discuss the possibility of a hereditary predisposition to cancer, to discuss genetic testing, and to further clarify her future cancer risks, as well as potential cancer risks for family members.   In September 2024, at the age of 89, Ms. Ciolino was diagnosed with invasive ductal carcinoma of the left breast (ER+/PR+/HER2-). The preliminary treatment plan includes breast conserving surgery, Oncotype to determine potential benefit of chemotherapy, adjuvant radiation, and anti-estrogens.  Genetic testing in 2014 through [facility] was negative with a variant of uncertain significance in BRCA2 called c.3326C>T p.Ala1109Val (now likely benign in ClinVar). The Breast/Ovarian Cancer Panel through [facility] included the following 21 genes---ATM, BARD1, BRCA1, BRCA2, BRIP1, CDH1, CHEK2, EPCAM, FANCC, MLH1, MSH2, MSH6, NBN, PALB2, PMS2, PTEN, RAD51C, RAD51D, STK11, TP53, and XRCC2.  Report date is April 11, 2013.    CANCER HISTORY:  Oncology History  Malignant neoplasm of upper-outer quadrant of left breast in female, estrogen receptor positive (HCC)  05/12/2023 Initial Diagnosis   Screening mammogram detected screening mammogram detected left breast asymmetry/architectural distortion which was palpable measuring 0.8 cm, axilla negative, biopsy grade 1 IDC ER 100%, PR 100%, Ki67 2%, HER2 1+ negative   05/19/2023 Cancer Staging   Staging form: Breast,  AJCC 8th Edition - Clinical stage from 05/19/2023: Stage IA (cT1b, cN0, cM0, G1, ER+, PR+, HER2-) - Signed by Serena Croissant, MD on 05/19/2023 Stage prefix: Initial diagnosis Histologic grading system: 3 grade system Laterality: Left Staged by: Pathologist and managing physician Stage used in treatment planning: Yes National guidelines used in treatment planning: Yes Type of national guideline used in treatment planning: NCCN     Past Medical History:  Diagnosis Date   Depression    GERD (gastroesophageal reflux disease)    occ, not a problem   Hemorrhoids, internal, with bleeding    Hyperlipidemia    Hypothyroidism     Past Surgical History:  Procedure Laterality Date   APPENDECTOMY  1975   BREAST SURGERY     Reduction   CARDIAC CATHETERIZATION N/A 10/07/2015   Procedure: Left Heart Cath and Coronary Angiography;  Surgeon: Lyn Records, MD;  [facility] INVASIVE CV LAB;  Service: Cardiovascular;  Laterality: N/A;   COLONOSCOPY WITH PROPOFOL N/A 06/23/2016   Procedure: COLONOSCOPY WITH PROPOFOL;  Surgeon: Charolett Bumpers, MD;  Location: [facility] ENDOSCOPY;  Service: Endoscopy;  Laterality: N/A;   GYNECOLOGIC CRYOSURGERY  age 17    FAMILY HISTORY:  We obtained a detailed, 4-generation family history.  Significant diagnoses are listed below: Family History  Problem Relation Age of Onset   Breast cancer Mother 78   Kidney cancer Mother 66   Breast cancer Paternal Aunt 75     Ms. Luber is unaware of previous family history of genetic testing for hereditary cancer risks. There is no reported Ashkenazi Jewish ancestry. There is no known consanguinity.  GENETIC COUNSELING ASSESSMENT: Ms. Rubach is a 67 y.o. female with previously  negative genetic testing and a personal and family history of breast cancer which is somewhat suggestive of a predisposition to cancer . We, therefore, discussed and recommended the following at today's visit.   DISCUSSION:  We discussed that, in general,  most cancer is not inherited in families, but instead is sporadic or familial. Sporadic cancers occur by chance and typically happen at older ages (>50 years) as this type of cancer is caused by genetic changes acquired during an individual's lifetime. Some families have more cancers than would be expected by chance; however, the ages or types of cancer are not consistent with a known genetic mutation or known genetic mutations have been ruled out. This type of familial cancer is thought to be due to a combination of multiple genetic, environmental, hormonal, and lifestyle factors. While this combination of factors likely increases the risk of cancer, the exact source of this risk is not currently identifiable or testable.    We discussed that approximately 5-10% of cancer is hereditary, meaning that it is due to a mutation in a single gene that is passed down from generation to generation in a family. Most hereditary cases of breast cancer are associated with mutations in BRCA1/2. There are other genes that can be associated an increased risk for breast cancer, including but not limited to PALB2, CHEK2, and ATM. We discussed that testing can be beneficial for several reasons, including knowing about other cancer risks, identifying potential screening and risk-reduction options that may be appropriate, and to understand if other family members could be at risk for cancer and allow them to undergo genetic testing.  We reviewed her previous negative genetic testing results.  We reviewed that the VUS in BRCA2 is treated like a negative result and classified as 'likely benign' at most genetic testing laboratories.  We discussed that her negative genetic testing from 2014 could indicate that the cancer in the family is not hereditary (but rather familial, caused by a combination of smaller genetic factors/environmental factors).  It's also possible that there was a hereditary cause of the cancer in her mother or  paternal aunt that she did not inherit.  It is also possible, although less likely, that there is a hereditary component for the cancer in the family that was unable to be detected by her testing in 2014.  While her testing in 2014 included all appropriate breast cancer genes, additional analysis (RNA analysis) is now available and can, in rare scenarios, detect mutations that was previously missed by DNA only analysis.  We discussed that given her personal and family history of breast cancer and the availability of RNA analysis, it is reasonable for her to consider updated genetic testing.  She was interested in proceeding with updated genetic testing.   The CancerNext-Expanded gene panel offered by [facility] and includes sequencing, rearrangement, and RNA analysis for the following 71 genes:  AIP, ALK, APC, ATM, BAP1, BARD1, BMPR1A, BRCA1, BRCA2, BRIP1, CDC73, CDH1, CDK4, CDKN1B, CDKN2A, CHEK2, DICER1, FH, FLCN, KIF1B, LZTR1,MAX, MEN1, MET, MLH1, MSH2, MSH6, MUTYH, NF1, NF2, NTHL1, PALB2, PHOX2B, PMS2, POT1, PRKAR1A, PTCH1, PTEN, RAD51C,RAD51D, RB1, RET, SDHA, SDHAF2, SDHB, SDHC, SDHD, SMAD4, SMARCA4, SMARCB1, SMARCE1, STK11, SUFU, TMEM127, TP53,TSC1, TSC2 and VHL (sequencing and deletion/duplication); AXIN2, CTNNA1, EGFR, EGLN1, HOXB13, KIT, MITF, MSH3, PDGFRA, POLD1 and POLE (sequencing only); EPCAM and GREM1 (deletion/duplication only).   We discussed that since she had genetic testing previously, she may have an out of pocket cost associated with testing. We discussed that if her  out of pocket cost for testing is over $100, the laboratory should contact her and discuss the self-pay prices [phone]) and/or patient pay assistance programs.    PLAN: After considering the risks, benefits, and limitations, Ms. Manson Passey provided informed consent to pursue genetic testing and the blood sample was sent to [facility] for analysis of the CancerNext-Expanded +RNAinsight Panel. Results should be available  within approximately 2-3 weeks' time, at which point they will be disclosed by telephone to Ms. Savidge, as will any additional recommendations warranted by these results. Ms. Timpone will receive a summary of her genetic counseling visit and a copy of her results once available. This information will also be available in Epic.  We discussed that given her mother's history of breast cancer before age 52, her siblings are candidates for genetic testing.  Ms. Mateja can let us know if we can be of any assistance in coordinating genetic counseling/testing for her siblings.   Ms. Petrey questions were answered to her satisfaction today. Our contact information was provided should additional questions or concerns arise. Thank you for the referral and allowing [place] to share in the care of your patient.   Miyu Fenderson M. Rennie Plowman, MS, [facility] Genetic Counselor [email or web address] (P) [phone]  The patient was seen for a total of 16 minutes in face-to-face genetic counseling.  The patient was accompanied by her son and husband.  Drs. Pamelia Hoit and/or Mosetta Putt were available to discuss this case as needed.   _______________________________________________________________________ For Office Staff:  Number of people involved in session: 3 Was an Intern/ student involved with case: no; nurse nav observed session

## 2023-05-19 NOTE — Progress Notes (Signed)
[facility] Spiritual Care Note  Met with Teresa Love") in Breast Multidisciplinary Clinic to introduce Support Center team/resources.  she completed SDOH screening; results follow below.    SDOH Screenings   Food Insecurity: No Food Insecurity (05/19/2023)  Housing: Low Risk  (05/19/2023)  Transportation Needs: No Transportation Needs (05/19/2023)  Utilities: Not At Risk (05/19/2023)  Depression (PHQ2-9): Low Risk  (05/19/2023)  Tobacco Use: Low Risk  (05/19/2023)     Chaplain and patient discussed common feelings and emotions when being diagnosed with cancer, and the importance of support during treatment.  Chaplain informed patient of the support team and support services at [facility].  Chaplain provided contact information and encouraged patient to call with any questions or concerns.  [name] reports that she has peace about her diagnosis and treatment plan, reporting very little distress now that she has learned the scope of her diagnosis and met her team.  Follow up needed: Yes.  We plan to follw up by phone in ca two weeks to check in about interest in an [employer] peer mentor or other needs.   [address] Rush B[place], [place], [facility] Pager [phone] Voicemail [phone]

## 2023-05-19 NOTE — Progress Notes (Signed)
[facility] CONSULT NOTE  Patient Care Team: Georgann Housekeeper, MD as PCP - General (Internal Medicine) Pershing Proud, RN as Oncology Nurse Navigator Donnelly Angelica, RN as Oncology Nurse Navigator Serena Croissant, MD as Consulting Physician (Hematology and Oncology) Antony Blackbird, MD as Consulting Physician (Radiation Oncology) Griselda Miner, MD as Consulting Physician (General Surgery)  CHIEF COMPLAINTS/PURPOSE OF CONSULTATION:  Newly diagnosed breast cancer  HISTORY OF PRESENTING ILLNESS:  Screening mammogram detected left breast asymmetry/architectural distortion measuring 0.8 cm.  She underwent a biopsy which revealed grade 1 IDC that is ER/PR positive HER2 negative.  She was presented this morning in the multidisciplinary tumor board and she is here today to discuss her treatment plan.  I reviewed her records extensively and collaborated the history with the patient.  SUMMARY OF ONCOLOGIC HISTORY: Oncology History  Malignant neoplasm of upper-outer quadrant of left breast in female, estrogen receptor positive (HCC)  05/12/2023 Initial Diagnosis   Screening mammogram detected screening mammogram detected left breast asymmetry/architectural distortion which was palpable measuring 0.8 cm, axilla negative, biopsy grade 1 IDC ER 100%, PR 100%, Ki67 2%, HER2 1+ negative      MEDICAL HISTORY:  Past Medical History:  Diagnosis Date   Depression    GERD (gastroesophageal reflux disease)    occ, not a problem   Hemorrhoids, internal, with bleeding    Hyperlipidemia    Hypothyroidism     SURGICAL HISTORY: Past Surgical History:  Procedure Laterality Date   APPENDECTOMY  1975   BREAST SURGERY     Reduction   CARDIAC CATHETERIZATION N/A 10/07/2015   Procedure: Left Heart Cath and Coronary Angiography;  Surgeon: Lyn Records, MD;  Location: [facility] INVASIVE CV LAB;  Service: Cardiovascular;  Laterality: N/A;   COLONOSCOPY WITH PROPOFOL N/A 06/23/2016   Procedure:  COLONOSCOPY WITH PROPOFOL;  Surgeon: Charolett Bumpers, MD;  Location: [facility]COPY;  Service: Endoscopy;  Laterality: N/A;   GYNECOLOGIC CRYOSURGERY  age 39    SOCIAL HISTORY: Social History   Socioeconomic History   Marital status: Married    Spouse name: Not on file   Number of children: 2   Years of education: Not on file   Highest education level: Not on file  Occupational History    Employer: RETIRED  Tobacco Use   Smoking status: Never   Smokeless tobacco: Never  Vaping Use   Vaping status: Never Used  Substance and Sexual Activity   Alcohol use: Yes    Alcohol/week: 1.0 standard drink of alcohol    Types: 1 Standard drinks or equivalent per week   Drug use: No   Sexual activity: Not Currently    Birth control/protection: Post-menopausal    Comment: 1st intercourse 3 yo-1 partner  Other Topics Concern   Not on file  Social History Narrative   Not on file   Social Determinants of Health   Financial Resource Strain: Not on file  Food Insecurity: Not on file  Transportation Needs: Not on file  Physical Activity: Not on file  Stress: Not on file  Social Connections: Not on file  Intimate Partner Violence: Not on file    FAMILY HISTORY: Family History  Problem Relation Age of Onset   Hypertension Mother    Breast cancer Mother 79   Kidney cancer Mother 68   Heart disease Father    Hypertension Father    Breast cancer Paternal Aunt        Age 48   Tuberculosis Maternal Grandmother  ALLERGIES:  is allergic to amoxicillin-pot clavulanate.  MEDICATIONS:  Current Outpatient Medications  Medication Sig Dispense Refill   ALPRAZolam (XANAX) 0.5 MG tablet Take 0.5 mg by mouth 2 (two) times daily as needed for anxiety or sleep.     Biotin 5000 MCG CAPS Take 1 capsule by mouth every other day.     citalopram (CELEXA) 20 MG tablet Take 20 mg by mouth daily.      fluconazole (DIFLUCAN) 150 MG tablet Take 1 tablet (150 mg total) by mouth as needed. For yeast 10  tablet 1   levothyroxine (SYNTHROID) 100 MCG tablet Take 100 mcg by mouth daily.     LORazepam (ATIVAN) 1 MG tablet Take 1 mg by mouth as needed for anxiety.     Niacin (VITAMIN B-3 PO) Take 2,000 Int'l Units/L by mouth daily.     pantoprazole (PROTONIX) 20 MG tablet Take 20 mg by mouth daily.     pravastatin (PRAVACHOL) 40 MG tablet Take 40 mg by mouth daily. Takes at bedtime     PREMARIN vaginal cream Place 1 applicator vaginally 2 (two) times a week.     Semaglutide-Weight Management 2.4 MG/0.75ML SOAJ Inject 2.4 mg into the skin once a week.     hydrocortisone-pramoxine (ANALPRAM-HC) 2.5-1 % rectal cream Place 1 application rectally 3 (three) times daily. (Patient not taking: Reported on 02/16/2019) 30 g 0   No current facility-administered medications for this visit.    REVIEW OF SYSTEMS:   Constitutional: Denies fevers, chills or abnormal night sweats   All other systems were reviewed with the patient and are negative.  PHYSICAL EXAMINATION: ECOG PERFORMANCE STATUS: 1 - Symptomatic but completely ambulatory  Vitals:   05/19/23 1254  BP: (!) 146/80  Pulse: 67  Resp: 18  Temp: 97.7 F (36.5 C)  SpO2: 99%   Filed Weights   05/19/23 1254  Weight: 167 lb (75.8 kg)    GENERAL:alert, no distress and comfortable    LABORATORY DATA:  I have reviewed the data as listed Lab Results  Component Value Date   WBC 5.7 05/19/2023   HGB 13.4 05/19/2023   HCT 40.8 05/19/2023   MCV 92.3 05/19/2023   PLT 282 05/19/2023   Lab Results  Component Value Date   NA 137 05/19/2023   K 4.2 05/19/2023   CL 105 05/19/2023   CO2 27 05/19/2023    RADIOGRAPHIC STUDIES: I have personally reviewed the radiological reports and agreed with the findings in the report.  ASSESSMENT AND PLAN:  Malignant neoplasm of upper-outer quadrant of left breast in female, estrogen receptor positive (HCC) 05/12/2023:Screening mammogram detected screening mammogram detected left breast  asymmetry/architectural distortion which was palpable measuring 0.8 cm, axilla negative, biopsy grade 1 IDC ER 100%, PR 100%, Ki67 2%, HER2 1+ negative  Pathology and radiology counseling:Discussed with the patient, the details of pathology including the type of breast cancer,the clinical staging, the significance of ER, PR and HER-2/neu receptors and the implications for treatment. After reviewing the pathology in detail, we proceeded to discuss the different treatment options between surgery, radiation, chemotherapy, antiestrogen therapies.  Recommendations: 1. Breast conserving surgery followed by 2. Oncotype DX testing to determine if chemotherapy would be of any benefit (if the final size is greater than 1 cm) 3. Adjuvant radiation therapy followed by 4. Adjuvant antiestrogen therapy  Oncotype counseling: I discussed Oncotype DX test. I explained to the patient that this is a 21 gene panel to evaluate patient tumors DNA to calculate recurrence score. This  would help determine whether patient has high risk or low risk breast cancer. She understands that if her tumor was found to be high risk, she would benefit from systemic chemotherapy. If low risk, no need of chemotherapy.  Return to clinic after surgery to discuss final pathology report and then determine if Oncotype DX testing will need to be sent.   All questions were answered. The patient knows to call the clinic with any problems, questions or concerns.    Tamsen Meek, MD 05/19/23

## 2023-05-19 NOTE — Assessment & Plan Note (Signed)
05/12/2023:Screening mammogram detected screening mammogram detected left breast asymmetry/architectural distortion which was palpable measuring 0.8 cm, axilla negative, biopsy grade 1 IDC ER 100%, PR 100%, Ki67 2%, HER2 1+ negative  Pathology and radiology counseling:Discussed with the patient, the details of pathology including the type of breast cancer,the clinical staging, the significance of ER, PR and HER-2/neu receptors and the implications for treatment. After reviewing the pathology in detail, we proceeded to discuss the different treatment options between surgery, radiation, chemotherapy, antiestrogen therapies.  Recommendations: 1. Breast conserving surgery followed by 2. Oncotype DX testing to determine if chemotherapy would be of any benefit (if the final size is greater than 1 cm) 3. Adjuvant radiation therapy followed by 4. Adjuvant antiestrogen therapy  Oncotype counseling: I discussed Oncotype DX test. I explained to the patient that this is a 21 gene panel to evaluate patient tumors DNA to calculate recurrence score. This would help determine whether patient has high risk or low risk breast cancer. She understands that if her tumor was found to be high risk, she would benefit from systemic chemotherapy. If low risk, no need of chemotherapy.  Return to clinic after surgery to discuss final pathology report and then determine if Oncotype DX testing will need to be sent.

## 2023-05-25 ENCOUNTER — Encounter (HOSPITAL_BASED_OUTPATIENT_CLINIC_OR_DEPARTMENT_OTHER): Payer: Self-pay | Admitting: General Surgery

## 2023-05-25 ENCOUNTER — Other Ambulatory Visit: Payer: Self-pay

## 2023-05-26 ENCOUNTER — Telehealth: Payer: Self-pay | Admitting: *Deleted

## 2023-05-26 ENCOUNTER — Encounter: Payer: Self-pay | Admitting: *Deleted

## 2023-05-26 NOTE — Telephone Encounter (Signed)
Left message for a return phone call to follow up from [facility] 9/25 and assess navigation needs.

## 2023-05-31 DIAGNOSIS — C50912 Malignant neoplasm of unspecified site of left female breast: Secondary | ICD-10-CM | POA: Diagnosis not present

## 2023-06-01 DIAGNOSIS — C50412 Malignant neoplasm of upper-outer quadrant of left female breast: Secondary | ICD-10-CM | POA: Diagnosis not present

## 2023-06-01 MED ORDER — CHLORHEXIDINE GLUCONATE CLOTH 2 % EX PADS: 6.0000 | MEDICATED_PAD | Freq: Once | CUTANEOUS | Status: DC

## 2023-06-01 NOTE — Progress Notes (Signed)

## 2023-06-02 ENCOUNTER — Encounter: Payer: Self-pay | Admitting: General Practice

## 2023-06-02 ENCOUNTER — Ambulatory Visit (HOSPITAL_BASED_OUTPATIENT_CLINIC_OR_DEPARTMENT_OTHER)
Admission: RE | Admit: 2023-06-02 | Discharge: 2023-06-02 | Disposition: A | Discharge status: Home / Self Care | Payer: Medicare PPO | Attending: General Surgery | Admitting: General Surgery

## 2023-06-02 ENCOUNTER — Other Ambulatory Visit: Payer: Self-pay

## 2023-06-02 ENCOUNTER — Ambulatory Visit (HOSPITAL_BASED_OUTPATIENT_CLINIC_OR_DEPARTMENT_OTHER): Payer: Medicare PPO | Admitting: Anesthesiology

## 2023-06-02 ENCOUNTER — Encounter (HOSPITAL_BASED_OUTPATIENT_CLINIC_OR_DEPARTMENT_OTHER)
Admission: RE | Disposition: A | Discharge status: Home / Self Care | Payer: Self-pay | Source: Home / Self Care | Attending: General Surgery

## 2023-06-02 ENCOUNTER — Encounter (HOSPITAL_BASED_OUTPATIENT_CLINIC_OR_DEPARTMENT_OTHER): Payer: Self-pay | Admitting: General Surgery

## 2023-06-02 DIAGNOSIS — C50912 Malignant neoplasm of unspecified site of left female breast: Secondary | ICD-10-CM | POA: Diagnosis not present

## 2023-06-02 DIAGNOSIS — Z803 Family history of malignant neoplasm of breast: Secondary | ICD-10-CM | POA: Diagnosis not present

## 2023-06-02 DIAGNOSIS — Z1732 Human epidermal growth factor receptor 2 negative status: Secondary | ICD-10-CM | POA: Insufficient documentation

## 2023-06-02 DIAGNOSIS — E039 Hypothyroidism, unspecified: Secondary | ICD-10-CM | POA: Insufficient documentation

## 2023-06-02 DIAGNOSIS — F419 Anxiety disorder, unspecified: Secondary | ICD-10-CM | POA: Insufficient documentation

## 2023-06-02 DIAGNOSIS — Z170 Estrogen receptor positive status [ER+]: Secondary | ICD-10-CM | POA: Diagnosis not present

## 2023-06-02 DIAGNOSIS — Z1721 Progesterone receptor positive status: Secondary | ICD-10-CM | POA: Diagnosis not present

## 2023-06-02 DIAGNOSIS — J45909 Unspecified asthma, uncomplicated: Secondary | ICD-10-CM | POA: Diagnosis not present

## 2023-06-02 DIAGNOSIS — C50412 Malignant neoplasm of upper-outer quadrant of left female breast: Secondary | ICD-10-CM | POA: Diagnosis not present

## 2023-06-02 DIAGNOSIS — C73 Malignant neoplasm of thyroid gland: Secondary | ICD-10-CM | POA: Diagnosis not present

## 2023-06-02 DIAGNOSIS — Z01818 Encounter for other preprocedural examination: Secondary | ICD-10-CM

## 2023-06-02 DIAGNOSIS — K219 Gastro-esophageal reflux disease without esophagitis: Secondary | ICD-10-CM | POA: Diagnosis not present

## 2023-06-02 DIAGNOSIS — F32A Depression, unspecified: Secondary | ICD-10-CM | POA: Diagnosis not present

## 2023-06-02 HISTORY — DX: Anxiety disorder, unspecified: F41.9

## 2023-06-02 HISTORY — PX: BREAST LUMPECTOMY WITH RADIOACTIVE SEED LOCALIZATION: SHX6424

## 2023-06-02 HISTORY — DX: Other specified postprocedural states: Z98.890

## 2023-06-02 SURGERY — BREAST LUMPECTOMY WITH RADIOACTIVE SEED LOCALIZATION
Anesthesia: General | Site: Breast | Laterality: Left

## 2023-06-02 MED ORDER — BUPIVACAINE-EPINEPHRINE (PF) 0.25% -1:200000 IJ SOLN
INTRAMUSCULAR | Status: DC | PRN
  Administered 2023-06-02: 20 mL

## 2023-06-02 MED ORDER — ACETAMINOPHEN 500 MG PO TABS
1000.0000 mg | ORAL_TABLET | ORAL | Status: AC
  Administered 2023-06-02: 1000 mg via ORAL

## 2023-06-02 MED ORDER — VANCOMYCIN HCL IN DEXTROSE 1-5 GM/200ML-% IV SOLN
1000.0000 mg | INTRAVENOUS | Status: AC
  Administered 2023-06-02: 1000 mg via INTRAVENOUS

## 2023-06-02 MED ORDER — LACTATED RINGERS IV SOLN: INTRAVENOUS | Status: DC

## 2023-06-02 MED ORDER — ONDANSETRON HCL 4 MG/2ML IJ SOLN
INTRAMUSCULAR | Status: DC | PRN
  Administered 2023-06-02: 4 mg via INTRAVENOUS

## 2023-06-02 MED ORDER — ONDANSETRON HCL 4 MG/2ML IJ SOLN
INTRAMUSCULAR | Status: AC
  Filled 2023-06-02: qty 2

## 2023-06-02 MED ORDER — FENTANYL CITRATE (PF) 100 MCG/2ML IJ SOLN
INTRAMUSCULAR | Status: DC | PRN
  Administered 2023-06-02: 100 ug via INTRAVENOUS

## 2023-06-02 MED ORDER — DEXAMETHASONE SODIUM PHOSPHATE 10 MG/ML IJ SOLN
INTRAMUSCULAR | Status: AC
  Filled 2023-06-02: qty 1

## 2023-06-02 MED ORDER — LIDOCAINE 2% (20 MG/ML) 5 ML SYRINGE
INTRAMUSCULAR | Status: AC
  Filled 2023-06-02: qty 5

## 2023-06-02 MED ORDER — OXYCODONE HCL 5 MG PO TABS: 5.0000 mg | ORAL_TABLET | Freq: Four times a day (QID) | ORAL | 0 refills | Status: DC | PRN

## 2023-06-02 MED ORDER — SODIUM CHLORIDE 0.9% FLUSH: 3.0000 mL | Freq: Two times a day (BID) | INTRAVENOUS | Status: DC

## 2023-06-02 MED ORDER — SCOPOLAMINE 1 MG/3DAYS TD PT72
1.0000 | MEDICATED_PATCH | TRANSDERMAL | Status: DC
  Administered 2023-06-02: 1.5 mg via TRANSDERMAL

## 2023-06-02 MED ORDER — FENTANYL CITRATE (PF) 100 MCG/2ML IJ SOLN
INTRAMUSCULAR | Status: AC
  Filled 2023-06-02: qty 2

## 2023-06-02 MED ORDER — FENTANYL CITRATE (PF) 100 MCG/2ML IJ SOLN: 25.0000 ug | INTRAMUSCULAR | Status: DC | PRN

## 2023-06-02 MED ORDER — SCOPOLAMINE 1 MG/3DAYS TD PT72
MEDICATED_PATCH | TRANSDERMAL | Status: AC
  Filled 2023-06-02: qty 1

## 2023-06-02 MED ORDER — LIDOCAINE HCL (CARDIAC) PF 100 MG/5ML IV SOSY
PREFILLED_SYRINGE | INTRAVENOUS | Status: DC | PRN
  Administered 2023-06-02: 100 mg via INTRAVENOUS

## 2023-06-02 MED ORDER — MIDAZOLAM HCL 2 MG/2ML IJ SOLN
INTRAMUSCULAR | Status: DC | PRN
  Administered 2023-06-02: 2 mg via INTRAVENOUS

## 2023-06-02 MED ORDER — MIDAZOLAM HCL 2 MG/2ML IJ SOLN
INTRAMUSCULAR | Status: AC
  Filled 2023-06-02: qty 2

## 2023-06-02 MED ORDER — LACTATED RINGERS IV SOLN: INTRAVENOUS | Status: DC | PRN

## 2023-06-02 MED ORDER — PROPOFOL 10 MG/ML IV BOLUS
INTRAVENOUS | Status: DC | PRN
  Administered 2023-06-02: 150 mg via INTRAVENOUS

## 2023-06-02 MED ORDER — EPHEDRINE SULFATE (PRESSORS) 50 MG/ML IJ SOLN
INTRAMUSCULAR | Status: DC | PRN
  Administered 2023-06-02 (×3): 10 mg via INTRAVENOUS

## 2023-06-02 MED ORDER — GABAPENTIN 100 MG PO CAPS
100.0000 mg | ORAL_CAPSULE | ORAL | Status: AC
  Administered 2023-06-02: 100 mg via ORAL

## 2023-06-02 MED ORDER — DEXAMETHASONE SODIUM PHOSPHATE 10 MG/ML IJ SOLN
INTRAMUSCULAR | Status: DC | PRN
  Administered 2023-06-02: 5 mg via INTRAVENOUS

## 2023-06-02 SURGICAL SUPPLY — 39 items
ADH SKN CLS APL DERMABOND .7 (GAUZE/BANDAGES/DRESSINGS) ×1
APL PRP STRL LF DISP 70% ISPRP (MISCELLANEOUS) ×1
APPLIER CLIP 9.375 MED OPEN (MISCELLANEOUS) ×1
APR CLP MED 9.3 20 MLT OPN (MISCELLANEOUS) ×1
BLADE SURG 15 STRL LF DISP TIS (BLADE) ×1 IMPLANT
BLADE SURG 15 STRL SS (BLADE) ×1
CANISTER SUC SOCK COL 7IN (MISCELLANEOUS) ×1 IMPLANT
CANISTER SUCT 1200ML W/VALVE (MISCELLANEOUS) ×1 IMPLANT
CHLORAPREP W/TINT 26 (MISCELLANEOUS) ×1 IMPLANT
CLIP APPLIE 9.375 MED OPEN (MISCELLANEOUS) IMPLANT
COVER BACK TABLE 60X90IN (DRAPES) ×1 IMPLANT
COVER MAYO STAND STRL (DRAPES) ×1 IMPLANT
COVER PROBE CYLINDRICAL 5X96 (MISCELLANEOUS) ×1 IMPLANT
DERMABOND ADVANCED .7 DNX12 (GAUZE/BANDAGES/DRESSINGS) ×1 IMPLANT
DRAPE LAPAROSCOPIC ABDOMINAL (DRAPES) ×1 IMPLANT
DRAPE UTILITY XL STRL (DRAPES) ×1 IMPLANT
ELECT COATED BLADE 2.86 ST (ELECTRODE) ×1 IMPLANT
ELECT REM PT RETURN 9FT ADLT (ELECTROSURGICAL) ×1
ELECTRODE REM PT RTRN 9FT ADLT (ELECTROSURGICAL) ×1 IMPLANT
GLOVE BIO SURGEON STRL SZ7.5 (GLOVE) ×2 IMPLANT
GOWN STRL REUS W/ TWL LRG LVL3 (GOWN DISPOSABLE) ×2 IMPLANT
GOWN STRL REUS W/TWL LRG LVL3 (GOWN DISPOSABLE) ×2
KIT MARKER MARGIN INK (KITS) ×1 IMPLANT
NDL HYPO 25X1 1.5 SAFETY (NEEDLE) IMPLANT
NEEDLE HYPO 25X1 1.5 SAFETY (NEEDLE) ×1
NS IRRIG 1000ML POUR BTL (IV SOLUTION) IMPLANT
PACK BASIN DAY SURGERY FS (CUSTOM PROCEDURE TRAY) ×1 IMPLANT
PENCIL SMOKE EVACUATOR (MISCELLANEOUS) ×1 IMPLANT
SLEEVE SCD COMPRESS KNEE MED (STOCKING) ×1 IMPLANT
SPIKE FLUID TRANSFER (MISCELLANEOUS) IMPLANT
SPONGE T-LAP 18X18 ~~LOC~~+RFID (SPONGE) ×1 IMPLANT
SUT MON AB 4-0 PC3 18 (SUTURE) ×1 IMPLANT
SUT SILK 2 0 SH (SUTURE) IMPLANT
SUT VICRYL 3-0 CR8 SH (SUTURE) ×1 IMPLANT
SYR CONTROL 10ML LL (SYRINGE) IMPLANT
TOWEL GREEN STERILE FF (TOWEL DISPOSABLE) ×1 IMPLANT
TRAY FAXITRON CT DISP (TRAY / TRAY PROCEDURE) ×1 IMPLANT
TUBE CONNECTING 20X1/4 (TUBING) ×1 IMPLANT
YANKAUER SUCT BULB TIP NO VENT (SUCTIONS) IMPLANT

## 2023-06-02 NOTE — Anesthesia Preprocedure Evaluation (Addendum)
Anesthesia Evaluation  Patient identified by MRN, date of birth, ID band Patient awake    Reviewed: Allergy & Precautions, NPO status , Patient's Chart, lab work & pertinent test results  History of Anesthesia Complications (+) PONV and history of anesthetic complications  Airway Mallampati: III  TM Distance: >3 FB Neck ROM: Full    Dental no notable dental hx. (+) Teeth Intact, Dental Advisory Given   Pulmonary asthma    Pulmonary exam normal breath sounds clear to auscultation       Cardiovascular negative cardio ROS Normal cardiovascular exam Rhythm:Regular Rate:Normal     Neuro/Psych  PSYCHIATRIC DISORDERS Anxiety Depression    negative neurological ROS     GI/Hepatic Neg liver ROS,GERD  ,,  Endo/Other  Hypothyroidism    Renal/GU negative Renal ROS  negative genitourinary   Musculoskeletal negative musculoskeletal ROS (+)    Abdominal   Peds  Hematology negative hematology ROS (+)   Anesthesia Other Findings   Reproductive/Obstetrics                             Anesthesia Physical Anesthesia Plan  ASA: 2  Anesthesia Plan: General   Post-op Pain Management: Tylenol PO (pre-op)*   Induction: Intravenous  PONV Risk Score and Plan: 4 or greater and Ondansetron, Dexamethasone, Midazolam, TIVA and Scopolamine patch - Pre-op  Airway Management Planned: LMA  Additional Equipment:   Intra-op Plan:   Post-operative Plan: Extubation in OR  Informed Consent: I have reviewed the patients History and Physical, chart, labs and discussed the procedure including the risks, benefits and alternatives for the proposed anesthesia with the patient or authorized representative who has indicated his/her understanding and acceptance.     Dental advisory given  Plan Discussed with: CRNA  Anesthesia Plan Comments:        Anesthesia Quick Evaluation

## 2023-06-02 NOTE — Progress Notes (Signed)
[facility] Spiritual Care Note  Reached Teresa Love by phone preoperatively to offer care and encouragement. We plan to follow up in more detail next week.   [address] Rush [place], [place], [facility] Pager [phone] Voicemail [phone]

## 2023-06-02 NOTE — Interval H&P Note (Signed)
History and Physical Interval Note:  06/02/2023 2:07 PM  Teresa Love  has presented today for surgery, with the diagnosis of LEFT BREAST CANCER.  The various methods of treatment have been discussed with the patient and family. After consideration of risks, benefits and other options for treatment, the patient has consented to  Procedure(s): LEFT BREAST LUMPECTOMY WITH RADIOACTIVE SEED LOCALIZATION (Left) as a surgical intervention.  The patient's history has been reviewed, patient examined, no change in status, stable for surgery.  I have reviewed the patient's chart and labs.  Questions were answered to the patient's satisfaction.     Chevis Pretty III

## 2023-06-02 NOTE — H&P (Signed)
REFERRING PHYSICIAN: Sabas Sous, MD PROVIDER: Lindell Noe, MD MRN: [id number] DOB: 1956-07-05 Subjective   Chief Complaint: New Consultation [facility])  History of Present Illness: Teresa Love is a 67 y.o. female who is seen today as an office consultation for evaluation of New Consultation [facility])  We are asked to see the patient in consultation by Dr. Pamelia Hoit to evaluate her for a new left breast cancer. The patient is a 67 year old white female who recently went for a routine screening mammogram. At that time she was found to have an 8 mm asymmetry in the upper outer quadrant of the left breast. The axilla looked normal. 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 She is otherwise in good health. She does not smoke. He does have a family history of breast cancer in her mother at the age of 3. She has had a breast reduction many years ago by Dr. Stephens November  Review of Systems: A complete review of systems was obtained from the patient. I have reviewed this information and discussed as appropriate with the patient. See HPI as well for other ROS.  ROS   Medical History: Past Medical History:  Diagnosis Date  Anxiety  GERD (gastroesophageal reflux disease)  History of cancer  Hyperlipidemia  Thyroid disease   Patient Active Problem List  Diagnosis  Malignant neoplasm of upper-outer quadrant of left breast in female, estrogen receptor positive (CMS/HHS-HCC)   Past Surgical History:  Procedure Laterality Date  Cardiac Catherization 10/07/2015  APPENDECTOMY    Allergies  Allergen Reactions  Augmentin [Amoxicillin-Pot Clavulanate] Unknown   Current Outpatient Medications on File Prior to Visit  Medication Sig Dispense Refill  ALPRAZolam (XANAX) 0.5 MG tablet  calcium carbonate-vitamin D3 (CALTRATE 600+D) 600 mg-10 mcg (400 unit) tablet Take 1 tablet by mouth 2 (two) times  daily with meals  citalopram (CELEXA) 20 MG tablet  coenzyme Q10-vitamin E 100-5 mg-unit Cap Take 1 tablet by mouth once daily  esomeprazole (NEXIUM) 40 MG DR capsule Take 40 mg by mouth every morning before breakfast  fluconazole (DIFLUCAN) 150 MG tablet  levothyroxine (SYNTHROID) 75 MCG tablet  liothyronine (CYTOMEL) 5 MCG tablet  phentermine (ADIPEX-P) 30 MG capsule Take 30 mg by mouth every morning before breakfast  pravastatin (PRAVACHOL) 40 MG tablet  zolpidem (AMBIEN) 10 mg tablet   No current facility-administered medications on file prior to visit.   Family History  Problem Relation Age of Onset  Breast cancer Mother  Stroke Father  Skin cancer Father  High blood pressure (Hypertension) Father  Hyperlipidemia (Elevated cholesterol) Father  Coronary Artery Disease (Blocked arteries around heart) Father    Social History   Tobacco Use  Smoking Status Never  Smokeless Tobacco Never    Social History   Socioeconomic History  Marital status: Married  Tobacco Use  Smoking status: Never  Smokeless tobacco: Never  Vaping Use  Vaping status: Never Used  Substance and Sexual Activity  Alcohol use: Yes  Drug use: Never   Objective:  There were no vitals filed for this visit.  There is no height or weight on file to calculate BMI.  Physical Exam Vitals reviewed.  Constitutional:  General: She is not in acute distress. Appearance: Normal appearance.  HENT:  Head: Normocephalic and atraumatic.  Right Ear: External ear normal.  Left Ear: External ear normal.  Nose: Nose normal.  Mouth/Throat:  Mouth: Mucous membranes  are moist.  Pharynx: Oropharynx is clear.  Eyes:  General: No scleral icterus. Extraocular Movements: Extraocular movements intact.  Conjunctiva/sclera: Conjunctivae normal.  Pupils: Pupils are equal, round, and reactive to light.  Cardiovascular:  Rate and Rhythm: Normal rate and regular rhythm.  Pulses: Normal pulses.  Heart sounds: Normal  heart sounds.  Pulmonary:  Effort: Pulmonary effort is normal. No respiratory distress.  Breath sounds: Normal breath sounds.  Abdominal:  General: Bowel sounds are normal.  Palpations: Abdomen is soft.  Tenderness: There is no abdominal tenderness.  Musculoskeletal:  General: No swelling, tenderness or deformity. Normal range of motion.  Cervical back: Normal range of motion and neck supple.  Skin: General: Skin is warm and dry.  Coloration: Skin is not jaundiced.  Neurological:  General: No focal deficit present.  Mental Status: She is alert and oriented to person, place, and time.  Psychiatric:  Mood and Affect: Mood normal.  Behavior: Behavior normal.     Breast: There is some palpable scar tissue in the upper outer quadrant of the left breast from her previous surgery. Other than this there is no palpable mass in either breast. There is no palpable axillary, supraclavicular, or cervical lymphadenopathy.  Labs, Imaging and Diagnostic Testing:  Assessment and Plan:   Diagnoses and all orders for this visit:  Malignant neoplasm of upper-outer quadrant of left breast in female, estrogen receptor positive (CMS/HHS-HCC) - [facility] Case Posting Request; Future   The patient appears to have an 8 mm cancer in the upper outer quadrant of the left breast with clinically negative nodes and all favorable markers. I have discussed with her in detail the different options for treatment and at this point she favors breast conservation which I feel is very reasonable. There is good evidence from the sound trial that she would not need a node evaluation. I have discussed with her in detail the risks and benefits of the operation as well as some of the technical aspects including the use of a radioactive seed for localization and she understands and wishes to proceed. She will meet with medical and radiation oncology today to discuss adjuvant therapy. We will proceed with surgical scheduling.

## 2023-06-02 NOTE — Op Note (Signed)
06/02/2023  3:20 PM  PATIENT:  Teresa Love  67 y.o. female  PRE-OPERATIVE DIAGNOSIS:  LEFT BREAST CANCER  POST-OPERATIVE DIAGNOSIS:  LEFT BREAST CANCER  PROCEDURE:  Procedure(s): LEFT BREAST LUMPECTOMY WITH RADIOACTIVE SEED LOCALIZATION (Left)  SURGEON:  Surgeons and Role:    Griselda Miner, MD - Primary  PHYSICIAN ASSISTANT:   ASSISTANTS: none   ANESTHESIA:   local and general  EBL:  15 mL   BLOOD ADMINISTERED:none  DRAINS: none   LOCAL MEDICATIONS USED:  MARCAINE     SPECIMEN:  Source of Specimen:  left breast tissue  DISPOSITION OF SPECIMEN:  PATHOLOGY  COUNTS:  YES  TOURNIQUET:  * No tourniquets in log *  DICTATION: .Dragon Dictation  After informed consent was obtained the patient was brought to the operating room and placed in the supine position on the operating table.  After adequate induction of general anesthesia the patient's left breast was prepped with ChloraPrep, allowed to dry, and draped in usual sterile manner.  An appropriate timeout was performed.  Previously an I-125 seed was placed in the upper outer quadrant of the left breast to mark an area of invasive breast cancer.  The neoprobe was set to I-125 in the area of radioactivity was readily identified.  The area around this was infiltrated with quarter percent Marcaine.  I elected to make a curvilinear incision along the upper outer edge of the left breast with a 15 blade knife.  The incision was carried through the skin and subcutaneous tissue sharply with the electrocautery.  Dissection was then carried medially between the breast tissue and the subcutaneous fat and skin.  Once this dissection was beyond the area of the cancer I then removed a circular portion of breast tissue sharply with the electrocautery around the radioactive seed while checking the area of radioactivity frequently.  Once the tissue was removed it was oriented with the appropriate paint colors.  A specimen radiograph was  obtained that showed the clip and seed to be near the center of the specimen.  The specimen was then sent to pathology for further evaluation.  Hemostasis was achieved using the Bovie electrocautery.  The wound was then irrigated with saline and infiltrated with more quarter percent Marcaine.  The cavity was marked with clips.  The deep portion of the incision was then closed with layers of interrupted 3-0 Vicryl stitches.  The skin was then closed with interrupted 4-0 Monocryl subcuticular stitches.  Dermabond dressings were applied.  The patient tolerated the procedure well.  At the end of the case all needle sponge and instrument counts were correct.  The patient was then awakened and taken to recovery in stable condition.  PLAN OF CARE: Discharge to home after PACU  PATIENT DISPOSITION:  PACU - hemodynamically stable.   Delay start of Pharmacological VTE agent (>24hrs) due to surgical blood loss or risk of bleeding: not applicable

## 2023-06-02 NOTE — Anesthesia Procedure Notes (Signed)
Procedure Name: LMA Insertion Date/Time: 06/02/2023 2:41 PM  Performed by: Karen Kitchens, CRNAPre-anesthesia Checklist: Patient identified, Emergency Drugs available, Suction available and Patient being monitored Patient Re-evaluated:Patient Re-evaluated prior to induction Oxygen Delivery Method: Circle system utilized Preoxygenation: Pre-oxygenation with 100% oxygen Induction Type: IV induction Ventilation: Mask ventilation without difficulty LMA: LMA inserted LMA Size: 4.0 Number of attempts: 1 Airway Equipment and Method: Bite block Placement Confirmation: positive ETCO2, breath sounds checked- equal and bilateral and CO2 detector Tube secured with: Tape Dental Injury: Teeth and Oropharynx as per pre-operative assessment

## 2023-06-02 NOTE — Transfer of Care (Signed)
Immediate Anesthesia Transfer of Care Note  Patient: Teresa Love  Procedure(s) Performed: LEFT BREAST LUMPECTOMY WITH RADIOACTIVE SEED LOCALIZATION (Left: Breast)  Patient Location: PACU  Anesthesia Type:General  Level of Consciousness: awake, alert , and oriented  Airway & Oxygen Therapy: Patient Spontanous Breathing and Patient connected to face mask oxygen  Post-op Assessment: Report given to RN and Post -op Vital signs reviewed and stable  Post vital signs: Reviewed and stable  Last Vitals:  Vitals Value Taken Time  BP 137/67 06/02/23 1530  Temp    Pulse 85 06/02/23 1532  Resp 24 06/02/23 1532  SpO2 100 % 06/02/23 1532  Vitals shown include unfiled device data.  Last Pain:  Vitals:   06/02/23 1305  TempSrc: Oral  PainSc: 0-No pain      Patients Stated Pain Goal: 4 (06/02/23 1305)  Complications: No notable events documented.

## 2023-06-02 NOTE — Discharge Instructions (Signed)
  Post Anesthesia Home Care Instructions  Activity: Get plenty of rest for the remainder of the day. A responsible individual must stay with you for 24 hours following the procedure.  For the next 24 hours, DO NOT: -Drive a car -Advertising copywriter -Drink alcoholic beverages -Take any medication unless instructed by your physician -Make any legal decisions or sign important papers.  Meals: Start with liquid foods such as gelatin or soup. Progress to regular foods as tolerated. Avoid greasy, spicy, heavy foods. If nausea and/or vomiting occur, drink only clear liquids until the nausea and/or vomiting subsides. Call your physician if vomiting continues.  Special Instructions/Symptoms: Your throat may feel dry or sore from the anesthesia or the breathing tube placed in your throat during surgery. If this causes discomfort, gargle with warm salt water. The discomfort should disappear within 24 hours.  If you had a scopolamine patch placed behind your ear for the management of post- operative nausea and/or vomiting:  1. The medication in the patch is effective for 72 hours, after which it should be removed.  Wrap patch in a tissue and discard in the trash. Wash hands thoroughly with soap and water. 2. You may remove the patch earlier than 72 hours if you experience unpleasant side effects which may include dry mouth, dizziness or visual disturbances. 3. Avoid touching the patch. Wash your hands with soap and water after contact with the patch.     Last dose of tylenol given at 1:10pm

## 2023-06-03 ENCOUNTER — Encounter (HOSPITAL_BASED_OUTPATIENT_CLINIC_OR_DEPARTMENT_OTHER): Payer: Self-pay | Admitting: General Surgery

## 2023-06-03 NOTE — Anesthesia Postprocedure Evaluation (Signed)
Anesthesia Post Note  Patient: Teresa Love  Procedure(s) Performed: LEFT BREAST LUMPECTOMY WITH RADIOACTIVE SEED LOCALIZATION (Left: Breast)     Patient location during evaluation: PACU Anesthesia Type: General Level of consciousness: awake and alert Pain management: pain level controlled Vital Signs Assessment: post-procedure vital signs reviewed and stable Respiratory status: spontaneous breathing, nonlabored ventilation, respiratory function stable and patient connected to nasal cannula oxygen Cardiovascular status: blood pressure returned to baseline and stable Postop Assessment: no apparent nausea or vomiting Anesthetic complications: no  No notable events documented.  Last Vitals:  Vitals:   06/02/23 1600 06/02/23 1612  BP: 126/69 120/75  Pulse: 77 78  Resp: 13 16  Temp:  36.6 C  SpO2: 93% 96%    Last Pain:  Vitals:   06/02/23 1612  TempSrc:   PainSc: 3                  [name] L [name]

## 2023-06-04 LAB — SURGICAL PATHOLOGY

## 2023-06-09 ENCOUNTER — Encounter: Payer: Self-pay | Admitting: General Practice

## 2023-06-09 ENCOUNTER — Encounter: Payer: Self-pay | Admitting: *Deleted

## 2023-06-09 ENCOUNTER — Telehealth: Payer: Self-pay | Admitting: *Deleted

## 2023-06-09 DIAGNOSIS — D1801 Hemangioma of skin and subcutaneous tissue: Secondary | ICD-10-CM | POA: Diagnosis not present

## 2023-06-09 DIAGNOSIS — L814 Other melanin hyperpigmentation: Secondary | ICD-10-CM | POA: Diagnosis not present

## 2023-06-09 DIAGNOSIS — L570 Actinic keratosis: Secondary | ICD-10-CM | POA: Diagnosis not present

## 2023-06-09 DIAGNOSIS — L821 Other seborrheic keratosis: Secondary | ICD-10-CM | POA: Diagnosis not present

## 2023-06-09 DIAGNOSIS — Z85828 Personal history of other malignant neoplasm of skin: Secondary | ICD-10-CM | POA: Diagnosis not present

## 2023-06-09 DIAGNOSIS — Z419 Encounter for procedure for purposes other than remedying health state, unspecified: Secondary | ICD-10-CM | POA: Diagnosis not present

## 2023-06-09 NOTE — Progress Notes (Signed)
[facility] Spiritual Care Note  Made post-op check-in call, which delighted Ms Teresa Love. She reports doing very well after surgery, now experiencing very little pain and tiredness. She remains upbeat and positive, and is looking forward to getting her bearings with radiation. Ms Teresa Love is aware of ongoing chaplain availability and plans to reach out during radiation to set up an appointment together, in her words, "so I can show you how well I'm doing!"   Konrad Dolores, [facility] Pager [phone] Voicemail [phone]

## 2023-06-09 NOTE — Telephone Encounter (Signed)
Received order for oncotype testing. Requisition sent to pathology 

## 2023-06-11 ENCOUNTER — Telehealth: Payer: Self-pay | Admitting: *Deleted

## 2023-06-11 NOTE — Telephone Encounter (Signed)
Signed statement of medical necessity faxed to exact sciences.

## 2023-06-15 ENCOUNTER — Telehealth: Payer: Self-pay | Admitting: Genetic Counselor

## 2023-06-15 ENCOUNTER — Ambulatory Visit: Payer: Self-pay | Admitting: Genetic Counselor

## 2023-06-15 DIAGNOSIS — Z1379 Encounter for other screening for genetic and chromosomal anomalies: Secondary | ICD-10-CM

## 2023-06-15 DIAGNOSIS — Z803 Family history of malignant neoplasm of breast: Secondary | ICD-10-CM

## 2023-06-15 DIAGNOSIS — Z170 Estrogen receptor positive status [ER+]: Secondary | ICD-10-CM

## 2023-06-15 NOTE — Telephone Encounter (Signed)
Disclosed neg genetics

## 2023-06-15 NOTE — Progress Notes (Signed)
HPI:   Teresa Love was previously seen in the [facility] clinic due to a personal and family history of breast cancer and concerns regarding a hereditary predisposition to cancer.    Teresa Love recent genetic test results were disclosed to Teresa Love by telephone. These results and recommendations are discussed in more detail below.  CANCER HISTORY:  Oncology History  Malignant neoplasm of upper-outer quadrant of left breast in female, estrogen receptor positive (HCC)  05/12/2023 Initial Diagnosis   Screening mammogram detected screening mammogram detected left breast asymmetry/architectural distortion which was palpable measuring 0.8 cm, axilla negative, biopsy grade 1 IDC ER 100%, PR 100%, Ki67 2%, HER2 1+ negative   05/19/2023 Cancer Staging   Staging form: Breast, AJCC 8th Edition - Clinical stage from 05/19/2023: Stage IA (cT1b, cN0, cM0, G1, ER+, PR+, HER2-) - Signed by Serena Croissant, MD on 05/19/2023 Stage prefix: Initial diagnosis Histologic grading system: 3 grade system Laterality: Left Staged by: Pathologist and managing physician Stage used in treatment planning: Yes National guidelines used in treatment planning: Yes Type of national guideline used in treatment planning: NCCN   06/08/2023 Genetic Testing   Negative [facility] CancerNext-Expanded +RNAinsight Panel.  Report date is 06/08/2023.   The CancerNext-Expanded gene panel offered by [facility] and includes sequencing, rearrangement, and RNA analysis for the following 71 genes:  AIP, ALK, APC, ATM, BAP1, BARD1, BMPR1A, BRCA1, BRCA2, BRIP1, CDC73, CDH1, CDK4, CDKN1B, CDKN2A, CHEK2, DICER1, FH, FLCN, KIF1B, LZTR1,MAX, MEN1, MET, MLH1, MSH2, MSH6, MUTYH, NF1, NF2, NTHL1, PALB2, PHOX2B, PMS2, POT1, PRKAR1A, PTCH1, PTEN, RAD51C,RAD51D, RB1, RET, SDHA, SDHAF2, SDHB, SDHC, SDHD, SMAD4, SMARCA4, SMARCB1, SMARCE1, STK11, SUFU, TMEM127, TP53,TSC1, TSC2 and VHL (sequencing and deletion/duplication); AXIN2, CTNNA1, EGFR, EGLN1,  HOXB13, KIT, MITF, MSH3, PDGFRA, POLD1 and POLE (sequencing only); EPCAM and GREM1 (deletion/duplication only).      FAMILY HISTORY:  We obtained a detailed, 4-generation family history.  Significant diagnoses are listed below:      Family History  Problem Relation Age of Onset   Breast cancer Mother 42   Kidney cancer Mother 83   Breast cancer Paternal Aunt 60       Teresa Love is unaware of previous family history of genetic testing for hereditary cancer risks. There is no reported Ashkenazi Jewish ancestry. There is no known consanguinity.    GENETIC TEST RESULTS:  The [facility] CancerNext-Expanded +RNAinsight Panel found no pathogenic mutations.   The CancerNext-Expanded gene panel offered by [facility] and includes sequencing, rearrangement, and RNA analysis for the following 71 genes:  AIP, ALK, APC, ATM, BAP1, BARD1, BMPR1A, BRCA1, BRCA2, BRIP1, CDC73, CDH1, CDK4, CDKN1B, CDKN2A, CHEK2, DICER1, FH, FLCN, KIF1B, LZTR1,MAX, MEN1, MET, MLH1, MSH2, MSH6, MUTYH, NF1, NF2, NTHL1, PALB2, PHOX2B, PMS2, POT1, PRKAR1A, PTCH1, PTEN, RAD51C,RAD51D, RB1, RET, SDHA, SDHAF2, SDHB, SDHC, SDHD, SMAD4, SMARCA4, SMARCB1, SMARCE1, STK11, SUFU, TMEM127, TP53,TSC1, TSC2 and VHL (sequencing and deletion/duplication); AXIN2, CTNNA1, EGFR, EGLN1, HOXB13, KIT, MITF, MSH3, PDGFRA, POLD1 and POLE (sequencing only); EPCAM and GREM1 (deletion/duplication only).  .   The test report has been scanned into EPIC and is located under the Molecular Pathology section of the Results Review tab.  A portion of the result report is included below for reference. Genetic testing reported out on June 08, 2023.     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  Through [facility], this  variant is classified as likely benign and is not routinely  reported.   Even though a pathogenic variant was not identified, possible explanations for the cancer in the family may include: There may be no hereditary risk for cancer in the family. The cancers in Teresa Love and/or Teresa Love family may be sporadic/familial or due to other genetic and environmental factors.  Most cancer is not hereditary.  There may be a gene mutation in one of these genes that current testing methods cannot detect but that chance is small. There could be another gene that has not yet been discovered, or that we have not yet tested, that is responsible for the cancer diagnoses in the family.  It is also possible there is a hereditary cause for the cancer in the family that Teresa Love did not inherit.   Therefore, it is important to remain in touch with cancer genetics in the future so that we can continue to offer Teresa Love the most up to date genetic testing.    ADDITIONAL GENETIC TESTING:   Teresa Love genetic testing was fairly extensive.  If there are additional relevant genes identified to increase cancer risk that can be analyzed in the future, we would be happy to discuss and coordinate this testing at that time.    CANCER SCREENING RECOMMENDATIONS:  Teresa Love test result is considered negative (normal).  This means that we have not identified a hereditary cause for Teresa Love personal history of breast cancer at this time.   An individual's cancer risk and medical management are not determined by genetic test results alone. Overall cancer risk assessment incorporates additional factors, including personal medical history, family history, and any available genetic information that may result in a personalized plan for cancer prevention and surveillance. Therefore, it is recommended she continue to follow the cancer management and screening guidelines provided by Teresa Love oncology and primary healthcare provider.   RECOMMENDATIONS FOR FAMILY MEMBERS:   Since she did not inherit a  identifiable mutation in a cancer predisposition gene included on this panel, Teresa Love children could not have inherited a known mutation from Teresa Love in one of these genes. Individuals in this family might be at some increased risk of developing cancer, over the general population risk, due to the family history of cancer.  Individuals in the family should notify their providers of the family history of cancer. We recommend women in this family have a yearly mammogram beginning at age 11, or 8 years younger than the earliest onset of cancer, an annual clinical breast exam, and perform monthly breast self-exams.  Risk models that take into account family history and hormonal history may be helpful in determining appropriate breast cancer screening options for family members. Female relatives should speak with their providers about prostate cancer screening.  Other members of the family may still carry a pathogenic variant in one of these genes that Teresa Love did not inherit. Based on the family history of breast cancer in Teresa Love mother at age 77, we recommend Teresa Love's siblings consider genetic counseling and testing. Teresa Love can let us know if we can be of any assistance in coordinating genetic counseling and/or testing for these family members.     FOLLOW-UP:  Cancer genetics is a rapidly advancing field and it is possible that new genetic tests will be appropriate for Teresa Love and/or Teresa Love family members in the future. We encourage Teresa Love to remain in contact with cancer genetics, so we can update Teresa Love personal and family histories  and let Teresa Love know of advances in cancer genetics that may benefit this family.   Our contact number was provided.  They are welcome to call us at anytime with additional questions or concerns.   Teresa Krienke M. Rennie Plowman, MS, [facility] Genetic Counselor [email or web address] (P) [phone]

## 2023-06-18 DIAGNOSIS — C50412 Malignant neoplasm of upper-outer quadrant of left female breast: Secondary | ICD-10-CM | POA: Diagnosis not present

## 2023-06-18 DIAGNOSIS — Z170 Estrogen receptor positive status [ER+]: Secondary | ICD-10-CM | POA: Diagnosis not present

## 2023-06-22 ENCOUNTER — Encounter: Payer: Self-pay | Admitting: *Deleted

## 2023-06-22 ENCOUNTER — Telehealth: Payer: Self-pay | Admitting: *Deleted

## 2023-06-22 DIAGNOSIS — Z170 Estrogen receptor positive status [ER+]: Secondary | ICD-10-CM

## 2023-06-22 NOTE — Telephone Encounter (Signed)
Received oncotype results of 7/3%. Referral placed for Dr. Roselind Messier.

## 2023-06-23 ENCOUNTER — Other Ambulatory Visit: Payer: Self-pay

## 2023-06-23 ENCOUNTER — Inpatient Hospital Stay: Payer: Medicare PPO | Attending: Hematology and Oncology | Admitting: Hematology and Oncology

## 2023-06-23 VITALS — BP 123/68 | HR 72 | Temp 97.5°F | Resp 18 | Ht 65.0 in | Wt 166.2 lb

## 2023-06-23 DIAGNOSIS — C50412 Malignant neoplasm of upper-outer quadrant of left female breast: Secondary | ICD-10-CM | POA: Diagnosis not present

## 2023-06-23 DIAGNOSIS — Z170 Estrogen receptor positive status [ER+]: Secondary | ICD-10-CM | POA: Insufficient documentation

## 2023-06-23 NOTE — Assessment & Plan Note (Signed)
06/02/2023:Left lumpectomy: Grade 1 IDC 1.1 cm, Marg negative for lymphovascular invasion ER 100%, PR 100%, HER2 1+ negative, Ki-67 2% oncotype results of 7/3%   Pathology counseling: I discussed the final pathology report of the patient provided  a copy of this report. I discussed the margins as well as lymph node surgeries. We also discussed the final staging along with previously performed ER/PR and HER-2/neu testing.  Treatment plan: Adjuvant radiation Adjuvant antiestrogen therapy  Return to clinic after radiation is complete to start antiestrogens

## 2023-06-23 NOTE — Progress Notes (Signed)
Patient Care Team: Georgann Housekeeper, MD as PCP - General (Internal Medicine) Pershing Proud, RN as Oncology Nurse Navigator Donnelly Angelica, RN as Oncology Nurse Navigator Serena Croissant, MD as Consulting Physician (Hematology and Oncology) Antony Blackbird, MD as Consulting Physician (Radiation Oncology) Griselda Miner, MD as Consulting Physician (General Surgery)  DIAGNOSIS:  Encounter Diagnosis  Name Primary?   Malignant neoplasm of upper-outer quadrant of left breast in female, estrogen receptor positive (HCC) Yes    SUMMARY OF ONCOLOGIC HISTORY: Oncology History  Malignant neoplasm of upper-outer quadrant of left breast in female, estrogen receptor positive (HCC)  05/12/2023 Initial Diagnosis   Screening mammogram detected screening mammogram detected left breast asymmetry/architectural distortion which was palpable measuring 0.8 cm, axilla negative, biopsy grade 1 IDC ER 100%, PR 100%, Ki67 2%, HER2 1+ negative   05/19/2023 Cancer Staging   Staging form: Breast, AJCC 8th Edition - Clinical stage from 05/19/2023: Stage IA (cT1b, cN0, cM0, G1, ER+, PR+, HER2-) - Signed by Serena Croissant, MD on 05/19/2023 Stage prefix: Initial diagnosis Histologic grading system: 3 grade system Laterality: Left Staged by: Pathologist and managing physician Stage used in treatment planning: Yes National guidelines used in treatment planning: Yes Type of national guideline used in treatment planning: NCCN   06/02/2023 Surgery   Left lumpectomy: Grade 1 IDC 1.1 cm, Marg negative for lymphovascular invasion ER 100%, PR 100%, HER2 1+ negative, Ki-67 2%   06/08/2023 Genetic Testing   Negative [facility] CancerNext-Expanded +RNAinsight Panel.  Report date is 06/08/2023.   The CancerNext-Expanded gene panel offered by [facility] and includes sequencing, rearrangement, and RNA analysis for the following 71 genes:  AIP, ALK, APC, ATM, BAP1, BARD1, BMPR1A, BRCA1, BRCA2, BRIP1, CDC73, CDH1, CDK4, CDKN1B, CDKN2A,  CHEK2, DICER1, FH, FLCN, KIF1B, LZTR1,MAX, MEN1, MET, MLH1, MSH2, MSH6, MUTYH, NF1, NF2, NTHL1, PALB2, PHOX2B, PMS2, POT1, PRKAR1A, PTCH1, PTEN, RAD51C,RAD51D, RB1, RET, SDHA, SDHAF2, SDHB, SDHC, SDHD, SMAD4, SMARCA4, SMARCB1, SMARCE1, STK11, SUFU, TMEM127, TP53,TSC1, TSC2 and VHL (sequencing and deletion/duplication); AXIN2, CTNNA1, EGFR, EGLN1, HOXB13, KIT, MITF, MSH3, PDGFRA, POLD1 and POLE (sequencing only); EPCAM and GREM1 (deletion/duplication only).    06/23/2023 Oncotype testing   oncotype results of 7/3%      CHIEF COMPLIANT:   History of Present Illness   The patient, with a recent diagnosis of ductal breast cancer, underwent successful surgery with no reported post-operative pain. The final size of the tumor was 1.1 cm, and all resection margins were negative, indicating complete removal of the tumor. The cancer cells were not invading into the patient's lymph or blood vessel spaces or nerves, suggesting the cancer was limited to the area of the tumor. The cancer was 100% estrogen and progesterone positive, HER2 negative, with a growth rate of 2%. The final stage of the cancer was stage 1A.  The patient also underwent an Oncotype DX test, which analyzes the genes of the cancer cells to predict the need for chemotherapy. The patient's score was 7, indicating a low risk of recurrence and no need for chemotherapy.  The patient also has a history of genetic testing, which showed no inherited genes causing breast cancer. A variant of unknown significance was found in one gene, but it has since been reclassified as benign.  The patient expressed concerns about the side effects of radiation therapy and the antiestrogen pills.         ALLERGIES:  is allergic to amoxicillin-pot clavulanate and statins.  MEDICATIONS:  Current Outpatient Medications  Medication Sig Dispense Refill  ALPRAZolam (XANAX) 0.5 MG tablet Take 0.5 mg by mouth 2 (two) times daily as needed for anxiety or sleep.      Biotin 5000 MCG CAPS Take 1 capsule by mouth every other day.     citalopram (CELEXA) 20 MG tablet Take 20 mg by mouth at bedtime.     fluconazole (DIFLUCAN) 150 MG tablet Take 1 tablet (150 mg total) by mouth as needed. For yeast 10 tablet 1   influenza vaccine adjuvanted (FLUAD) 0.5 ML injection Inject into the muscle. 0.5 mL 0   levothyroxine (SYNTHROID) 100 MCG tablet Take 100 mcg by mouth at bedtime. PT ONLY TAKES MON-FRI     Niacin (VITAMIN B-3 PO) Take 2,000 Int'l Units/L by mouth daily.     oxyCODONE (ROXICODONE) 5 MG immediate release tablet Take 1 tablet (5 mg total) by mouth every 6 (six) hours as needed for severe pain. 10 tablet 0   pantoprazole (PROTONIX) 20 MG tablet Take 20 mg by mouth at bedtime.     pravastatin (PRAVACHOL) 40 MG tablet Take 40 mg by mouth daily. Takes at bedtime     Semaglutide-Weight Management 2.4 MG/0.75ML SOAJ Inject 2.4 mg into the skin once a week.     Turmeric 500 MG CAPS Take by mouth.     No current facility-administered medications for this visit.    PHYSICAL EXAMINATION: ECOG PERFORMANCE STATUS: 1 - Symptomatic but completely ambulatory  Vitals:   06/23/23 1238  BP: 123/68  Pulse: 72  Resp: 18  Temp: (!) 97.5 F (36.4 C)  SpO2: 98%   Filed Weights   06/23/23 1238  Weight: 166 lb 3.2 oz (75.4 kg)    Physical Exam          (exam performed in the presence of a chaperone)  LABORATORY DATA:  I have reviewed the data as listed    Latest Ref Rng & Units 05/19/2023   12:38 PM 10/03/2015   12:26 PM  CMP  Glucose 70 - 99 mg/dL 86  86   BUN 8 - 23 mg/dL 14  12   Creatinine 1.61 - 1.00 mg/dL 0.96  0.45   Sodium 409 - 145 mmol/L 137  139   Potassium 3.5 - 5.1 mmol/L 4.2  4.9   Chloride 98 - 111 mmol/L 105  103   CO2 22 - 32 mmol/L 27  24   Calcium 8.9 - 10.3 mg/dL 9.5  9.6   Total Protein 6.5 - 8.1 g/dL 7.1    Total Bilirubin 0.3 - 1.2 mg/dL 0.5    Alkaline Phos 38 - 126 U/L 83    AST 15 - 41 U/L 14    ALT 0 - 44 U/L 10       Lab Results  Component Value Date   WBC 5.7 05/19/2023   HGB 13.4 05/19/2023   HCT 40.8 05/19/2023   MCV 92.3 05/19/2023   PLT 282 05/19/2023   NEUTROABS 3.1 05/19/2023    ASSESSMENT & PLAN:  Malignant neoplasm of upper-outer quadrant of left breast in female, estrogen receptor positive (HCC) 06/02/2023:Left lumpectomy: Grade 1 IDC 1.1 cm, Marg negative for lymphovascular invasion ER 100%, PR 100%, HER2 1+ negative, Ki-67 2% oncotype results of 7/3%   Pathology counseling: I discussed the final pathology report of the patient provided  a copy of this report. I discussed the margins as well as lymph node surgeries. We also discussed the final staging along with previously performed ER/PR and HER-2/neu testing.  Treatment plan: Adjuvant radiation  Adjuvant antiestrogen therapy  Return to clinic after radiation is complete to start antiestrogens ------------------------------------- Assessment and Plan    Ductal Carcinoma of the Breast Post-surgical patient with 1.1 cm tumor, negative resection margins, no lymphovascular or perineural invasion. Estrogen and progesterone receptor positive, HER2 negative, low growth rate (2%). Stage 1A. Oncotype DX score of 7, indicating low risk of recurrence (3% over 10 years) and no need for chemotherapy. -Plan for radiation therapy, to be scheduled in approximately 10 days. -Initiate Anastrozole 1mg  daily for 5 years after completion of radiation therapy.  Genetic Testing No inherited gene mutations associated with breast cancer identified in 71-gene panel. -No further action required.  General Health Maintenance -Encourage daily walking for 30 minutes, 5 days a week to reduce risk of recurrence. -Consider Vitamin D supplementation (2000 IU daily). -Plan for bone density check every 2 years while on Anastrozole due to risk of osteoporosis.          No orders of the defined types were placed in this encounter.  The patient has a good  understanding of the overall plan. she agrees with it. she will call with any problems that may develop before the next visit here. Total time spent: 30 mins including face to face time and time spent for planning, charting and co-ordination of care   Tamsen Meek, MD 06/23/23

## 2023-06-25 ENCOUNTER — Inpatient Hospital Stay
Admission: RE | Admit: 2023-06-25 | Discharge: 2023-06-25 | Disposition: A | Discharge status: Home / Self Care | Payer: Self-pay | Source: Ambulatory Visit | Attending: Radiation Oncology | Admitting: Radiation Oncology

## 2023-06-25 ENCOUNTER — Encounter: Payer: Self-pay | Admitting: Hematology and Oncology

## 2023-06-25 ENCOUNTER — Other Ambulatory Visit: Payer: Self-pay | Admitting: Radiation Oncology

## 2023-06-25 DIAGNOSIS — C50412 Malignant neoplasm of upper-outer quadrant of left female breast: Secondary | ICD-10-CM

## 2023-06-25 DIAGNOSIS — Z170 Estrogen receptor positive status [ER+]: Secondary | ICD-10-CM

## 2023-06-28 DIAGNOSIS — C50912 Malignant neoplasm of unspecified site of left female breast: Secondary | ICD-10-CM | POA: Diagnosis not present

## 2023-06-28 DIAGNOSIS — E782 Mixed hyperlipidemia: Secondary | ICD-10-CM | POA: Diagnosis not present

## 2023-06-28 DIAGNOSIS — G4700 Insomnia, unspecified: Secondary | ICD-10-CM | POA: Diagnosis not present

## 2023-06-28 DIAGNOSIS — M8580 Other specified disorders of bone density and structure, unspecified site: Secondary | ICD-10-CM | POA: Diagnosis not present

## 2023-06-28 DIAGNOSIS — Z170 Estrogen receptor positive status [ER+]: Secondary | ICD-10-CM | POA: Diagnosis not present

## 2023-06-28 DIAGNOSIS — M85861 Other specified disorders of bone density and structure, right lower leg: Secondary | ICD-10-CM | POA: Diagnosis not present

## 2023-06-28 DIAGNOSIS — M85851 Other specified disorders of bone density and structure, right thigh: Secondary | ICD-10-CM | POA: Diagnosis not present

## 2023-06-28 DIAGNOSIS — E039 Hypothyroidism, unspecified: Secondary | ICD-10-CM | POA: Diagnosis not present

## 2023-06-28 DIAGNOSIS — F331 Major depressive disorder, recurrent, moderate: Secondary | ICD-10-CM | POA: Diagnosis not present

## 2023-06-28 DIAGNOSIS — Z0000 Encounter for general adult medical examination without abnormal findings: Secondary | ICD-10-CM | POA: Diagnosis not present

## 2023-06-28 DIAGNOSIS — K219 Gastro-esophageal reflux disease without esophagitis: Secondary | ICD-10-CM | POA: Diagnosis not present

## 2023-06-28 DIAGNOSIS — M85852 Other specified disorders of bone density and structure, left thigh: Secondary | ICD-10-CM | POA: Diagnosis not present

## 2023-06-29 NOTE — Progress Notes (Signed)
Radiation Oncology         [phone] ________________________________  Name: Teresa Love MRN: [id number]  Date: 06/30/2023  DOB: [birth year]  Re-Evaluation Note  CC: Georgann Housekeeper, MD  Serena Croissant, MD  No diagnosis found.  Diagnosis:  Stage IA (cT1b, cN0, cM0) Left Breast UOQ, Invasive Ductal Carcinoma, ER+ / PR+ / Her2-, Grade 1: s/p left breast lumpectomy without SLN evaluation   Narrative:  The patient returns today to discuss radiation treatment options. She was seen in the multidisciplinary breast clinic on 05/19/23.   She underwent genetic testing on her consultation date. Results showed no clinically significant variants detected by BRCAplus or +RNAinsight testing.  Since her consultation date, she opted to proceed with a left breast lumpectomy without nodal biopsies on 06/02/23 under the care of Dr. Carolynne Edouard. Pathology from the procedure revealed: tumor the size of 1.1 cm; histology of grade 1 invasive ductal carcinoma; negative for LVI or PNI; all margins negative for invasive carcinoma. Prognostic indicators significant for: estrogen receptor 100% positive and progesterone receptor 100% positive, both with strong staining intensity; Proliferation marker Ki67 at 2%; Her2 status negative; Grade 1.   Oncotype DX was obtained on the final surgical sample and the recurrence score of 7 predicts a risk of recurrence outside the breast over the next 9 years of 3%, if the patient's only systemic therapy were to be an antiestrogen for 5 years. It also predicts no significant benefit from chemotherapy.  Based on Dr. Earmon Phoenix recommendation, she has agreed to proceed with antiestrogen therapy consisting of Anastrozole x 5 years after completing radiation therapy.   On review of systems, the patient reports ***. She denies *** and any other symptoms.    Allergies:  is allergic to amoxicillin-pot clavulanate and statins.  Meds: Current Outpatient Medications  Medication Sig Dispense  Refill   ALPRAZolam (XANAX) 0.5 MG tablet Take 0.5 mg by mouth 2 (two) times daily as needed for anxiety or sleep.     Biotin 5000 MCG CAPS Take 1 capsule by mouth every other day.     citalopram (CELEXA) 20 MG tablet Take 20 mg by mouth at bedtime.     fluconazole (DIFLUCAN) 150 MG tablet Take 1 tablet (150 mg total) by mouth as needed. For yeast 10 tablet 1   influenza vaccine adjuvanted (FLUAD) 0.5 ML injection Inject into the muscle. 0.5 mL 0   levothyroxine (SYNTHROID) 100 MCG tablet Take 100 mcg by mouth at bedtime. PT ONLY TAKES MON-FRI     Niacin (VITAMIN B-3 PO) Take 2,000 Int'l Units/L by mouth daily.     oxyCODONE (ROXICODONE) 5 MG immediate release tablet Take 1 tablet (5 mg total) by mouth every 6 (six) hours as needed for severe pain. 10 tablet 0   pantoprazole (PROTONIX) 20 MG tablet Take 20 mg by mouth at bedtime.     pravastatin (PRAVACHOL) 40 MG tablet Take 40 mg by mouth daily. Takes at bedtime     Semaglutide-Weight Management 2.4 MG/0.75ML SOAJ Inject 2.4 mg into the skin once a week.     Turmeric 500 MG CAPS Take by mouth.     No current facility-administered medications for this encounter.    Physical Findings: The patient is in no acute distress. Patient is alert and oriented.  vitals were not taken for this visit.  No significant changes. Lungs are clear to auscultation bilaterally. Heart has regular rate and rhythm. No palpable cervical, supraclavicular, or axillary adenopathy. Abdomen soft, non-tender, normal bowel sounds. Right  Breast: no palpable mass, nipple discharge or bleeding. Left Breast: ***  Lab Findings: Lab Results  Component Value Date   WBC 5.7 05/19/2023   HGB 13.4 05/19/2023   HCT 40.8 05/19/2023   MCV 92.3 05/19/2023   PLT 282 05/19/2023    Radiographic Findings: No results found.  Impression: Stage IA (cT1b, cN0, cM0) Left Breast UOQ, Invasive Ductal Carcinoma, ER+ / PR+ / Her2-, Grade 1: s/p left breast lumpectomy without SLN  evaluation   ***  Plan:  Patient is scheduled for CT simulation {date/later today}. ***  -----------------------------------  Billie Lade, PhD, MD  This document serves as a record of services personally performed by Antony Blackbird, MD. It was created on his behalf by Neena Rhymes, a trained medical scribe. The creation of this record is based on the scribe's personal observations and the provider's statements to them. This document has been checked and approved by the attending provider.

## 2023-06-29 NOTE — Progress Notes (Signed)
Location of Breast Cancer:left    Malignant neoplasm of upper-outer quadrant of left breast in female, estrogen receptor positive (HCC)   Histology per Pathology Report:    Receptor Status: ER(100%), PR (100%), Her2-neu (negative), Ki-67(2)  Did patient present with symptoms (if so, please note symptoms) or was this found on screening mammography?: screening mammogram  Past/Anticipated interventions by surgeon, if ZOX:WRUE    Past/Anticipated interventions by medical oncology, if any: Dr. Pamelia Hoit   Lymphedema issues, if any:  {:18581} {t:21944}   Pain issues, if any:  {:18581} {PAIN DESCRIPTION:21022940}  SAFETY ISSUES: Prior radiation? {:18581} Pacemaker/ICD? {:18581} Possible current pregnancy?{:18581} Is the patient on methotrexate? {:18581}  Current Complaints / other details:  ***

## 2023-06-30 ENCOUNTER — Ambulatory Visit
Admission: RE | Admit: 2023-06-30 | Discharge: 2023-06-30 | Disposition: A | Discharge status: Home / Self Care | Payer: Medicare PPO | Source: Ambulatory Visit | Attending: Radiation Oncology | Admitting: Radiation Oncology

## 2023-06-30 ENCOUNTER — Encounter: Payer: Self-pay | Admitting: *Deleted

## 2023-06-30 ENCOUNTER — Encounter: Payer: Self-pay | Admitting: Radiation Oncology

## 2023-06-30 VITALS — BP 124/80 | HR 64 | Temp 97.4°F | Resp 20 | Ht 65.0 in | Wt 166.0 lb

## 2023-06-30 DIAGNOSIS — Z79899 Other long term (current) drug therapy: Secondary | ICD-10-CM | POA: Diagnosis not present

## 2023-06-30 DIAGNOSIS — Z510 Encounter for antineoplastic radiation therapy: Secondary | ICD-10-CM | POA: Diagnosis not present

## 2023-06-30 DIAGNOSIS — C50412 Malignant neoplasm of upper-outer quadrant of left female breast: Secondary | ICD-10-CM

## 2023-06-30 DIAGNOSIS — Z170 Estrogen receptor positive status [ER+]: Secondary | ICD-10-CM | POA: Insufficient documentation

## 2023-07-05 ENCOUNTER — Telehealth: Payer: Self-pay | Admitting: Hematology and Oncology

## 2023-07-05 DIAGNOSIS — C50412 Malignant neoplasm of upper-outer quadrant of left female breast: Secondary | ICD-10-CM | POA: Diagnosis not present

## 2023-07-05 DIAGNOSIS — Z510 Encounter for antineoplastic radiation therapy: Secondary | ICD-10-CM | POA: Diagnosis not present

## 2023-07-05 DIAGNOSIS — Z170 Estrogen receptor positive status [ER+]: Secondary | ICD-10-CM | POA: Diagnosis not present

## 2023-07-05 NOTE — Telephone Encounter (Signed)
Patient is aware of scheduled appointment following after radiation on 08/11/2023

## 2023-07-06 DIAGNOSIS — H52203 Unspecified astigmatism, bilateral: Secondary | ICD-10-CM | POA: Diagnosis not present

## 2023-07-06 DIAGNOSIS — H2513 Age-related nuclear cataract, bilateral: Secondary | ICD-10-CM | POA: Diagnosis not present

## 2023-07-13 ENCOUNTER — Other Ambulatory Visit: Payer: Self-pay

## 2023-07-13 ENCOUNTER — Ambulatory Visit
Admission: RE | Admit: 2023-07-13 | Discharge: 2023-07-13 | Disposition: A | Discharge status: Home / Self Care | Payer: Medicare PPO | Source: Ambulatory Visit | Attending: Radiation Oncology | Admitting: Radiation Oncology

## 2023-07-13 DIAGNOSIS — Z170 Estrogen receptor positive status [ER+]: Secondary | ICD-10-CM | POA: Diagnosis not present

## 2023-07-13 DIAGNOSIS — Z510 Encounter for antineoplastic radiation therapy: Secondary | ICD-10-CM | POA: Diagnosis not present

## 2023-07-13 DIAGNOSIS — C50412 Malignant neoplasm of upper-outer quadrant of left female breast: Secondary | ICD-10-CM

## 2023-07-13 LAB — RAD ONC ARIA SESSION SUMMARY
Course Elapsed Days: 0
Plan Fractions Treated to Date: 1
Plan Prescribed Dose Per Fraction: 2.67 Gy
Plan Total Fractions Prescribed: 15
Plan Total Prescribed Dose: 40.05 Gy
Reference Point Dosage Given to Date: 2.67 Gy
Reference Point Session Dosage Given: 2.67 Gy
Session Number: 1

## 2023-07-13 MED ORDER — RADIAPLEXRX EX GEL: Freq: Once | CUTANEOUS | Status: AC

## 2023-07-13 MED ORDER — ALRA NON-METALLIC DEODORANT (RAD-ONC)
1.0000 | Freq: Once | TOPICAL | Status: AC
  Administered 2023-07-13: 1 via TOPICAL

## 2023-07-14 ENCOUNTER — Ambulatory Visit
Admission: RE | Admit: 2023-07-14 | Discharge: 2023-07-14 | Disposition: A | Discharge status: Home / Self Care | Payer: Medicare PPO | Source: Ambulatory Visit | Attending: Radiation Oncology | Admitting: Radiation Oncology

## 2023-07-14 ENCOUNTER — Other Ambulatory Visit: Payer: Self-pay

## 2023-07-14 DIAGNOSIS — Z170 Estrogen receptor positive status [ER+]: Secondary | ICD-10-CM | POA: Diagnosis not present

## 2023-07-14 DIAGNOSIS — Z510 Encounter for antineoplastic radiation therapy: Secondary | ICD-10-CM | POA: Diagnosis not present

## 2023-07-14 DIAGNOSIS — C50412 Malignant neoplasm of upper-outer quadrant of left female breast: Secondary | ICD-10-CM | POA: Diagnosis not present

## 2023-07-14 LAB — RAD ONC ARIA SESSION SUMMARY
Course Elapsed Days: 1
Plan Fractions Treated to Date: 2
Plan Prescribed Dose Per Fraction: 2.67 Gy
Plan Total Fractions Prescribed: 15
Plan Total Prescribed Dose: 40.05 Gy
Reference Point Dosage Given to Date: 5.34 Gy
Reference Point Session Dosage Given: 2.67 Gy
Session Number: 2

## 2023-07-15 ENCOUNTER — Other Ambulatory Visit: Payer: Self-pay

## 2023-07-15 ENCOUNTER — Ambulatory Visit
Admission: RE | Admit: 2023-07-15 | Discharge: 2023-07-15 | Disposition: A | Discharge status: Home / Self Care | Payer: Medicare PPO | Source: Ambulatory Visit | Attending: Radiation Oncology | Admitting: Radiation Oncology

## 2023-07-15 DIAGNOSIS — C50412 Malignant neoplasm of upper-outer quadrant of left female breast: Secondary | ICD-10-CM | POA: Diagnosis not present

## 2023-07-15 DIAGNOSIS — Z510 Encounter for antineoplastic radiation therapy: Secondary | ICD-10-CM | POA: Diagnosis not present

## 2023-07-15 DIAGNOSIS — Z170 Estrogen receptor positive status [ER+]: Secondary | ICD-10-CM | POA: Diagnosis not present

## 2023-07-15 LAB — RAD ONC ARIA SESSION SUMMARY
Course Elapsed Days: 2
Plan Fractions Treated to Date: 3
Plan Prescribed Dose Per Fraction: 2.67 Gy
Plan Total Fractions Prescribed: 15
Plan Total Prescribed Dose: 40.05 Gy
Reference Point Dosage Given to Date: 8.01 Gy
Reference Point Session Dosage Given: 2.67 Gy
Session Number: 3

## 2023-07-16 ENCOUNTER — Other Ambulatory Visit: Payer: Self-pay

## 2023-07-16 ENCOUNTER — Ambulatory Visit
Admission: RE | Admit: 2023-07-16 | Discharge: 2023-07-16 | Disposition: A | Discharge status: Home / Self Care | Payer: Medicare PPO | Source: Ambulatory Visit | Attending: Radiation Oncology | Admitting: Radiation Oncology

## 2023-07-16 DIAGNOSIS — Z170 Estrogen receptor positive status [ER+]: Secondary | ICD-10-CM | POA: Diagnosis not present

## 2023-07-16 DIAGNOSIS — C50412 Malignant neoplasm of upper-outer quadrant of left female breast: Secondary | ICD-10-CM | POA: Diagnosis not present

## 2023-07-16 DIAGNOSIS — Z510 Encounter for antineoplastic radiation therapy: Secondary | ICD-10-CM | POA: Diagnosis not present

## 2023-07-16 LAB — RAD ONC ARIA SESSION SUMMARY
Course Elapsed Days: 3
Plan Fractions Treated to Date: 4
Plan Prescribed Dose Per Fraction: 2.67 Gy
Plan Total Fractions Prescribed: 15
Plan Total Prescribed Dose: 40.05 Gy
Reference Point Dosage Given to Date: 10.68 Gy
Reference Point Session Dosage Given: 2.67 Gy
Session Number: 4

## 2023-07-18 ENCOUNTER — Ambulatory Visit: Payer: Medicare PPO

## 2023-07-18 ENCOUNTER — Ambulatory Visit
Admission: RE | Admit: 2023-07-18 | Discharge: 2023-07-18 | Disposition: A | Discharge status: Home / Self Care | Payer: Medicare PPO | Source: Ambulatory Visit | Attending: Radiation Oncology | Admitting: Radiation Oncology

## 2023-07-18 ENCOUNTER — Other Ambulatory Visit: Payer: Self-pay

## 2023-07-18 DIAGNOSIS — C50412 Malignant neoplasm of upper-outer quadrant of left female breast: Secondary | ICD-10-CM | POA: Diagnosis not present

## 2023-07-18 DIAGNOSIS — Z510 Encounter for antineoplastic radiation therapy: Secondary | ICD-10-CM | POA: Diagnosis not present

## 2023-07-18 DIAGNOSIS — Z170 Estrogen receptor positive status [ER+]: Secondary | ICD-10-CM | POA: Diagnosis not present

## 2023-07-18 LAB — RAD ONC ARIA SESSION SUMMARY
Course Elapsed Days: 5
Plan Fractions Treated to Date: 5
Plan Prescribed Dose Per Fraction: 2.67 Gy
Plan Total Fractions Prescribed: 15
Plan Total Prescribed Dose: 40.05 Gy
Reference Point Dosage Given to Date: 13.35 Gy
Reference Point Session Dosage Given: 2.67 Gy
Session Number: 5

## 2023-07-19 ENCOUNTER — Ambulatory Visit
Admission: RE | Admit: 2023-07-19 | Discharge: 2023-07-19 | Disposition: A | Discharge status: Home / Self Care | Payer: Medicare PPO | Source: Ambulatory Visit | Attending: Radiation Oncology | Admitting: Radiation Oncology

## 2023-07-19 ENCOUNTER — Other Ambulatory Visit: Payer: Self-pay

## 2023-07-19 ENCOUNTER — Ambulatory Visit: Payer: Medicare PPO

## 2023-07-19 DIAGNOSIS — Z510 Encounter for antineoplastic radiation therapy: Secondary | ICD-10-CM | POA: Diagnosis not present

## 2023-07-19 DIAGNOSIS — Z170 Estrogen receptor positive status [ER+]: Secondary | ICD-10-CM | POA: Diagnosis not present

## 2023-07-19 DIAGNOSIS — C50412 Malignant neoplasm of upper-outer quadrant of left female breast: Secondary | ICD-10-CM | POA: Diagnosis not present

## 2023-07-19 LAB — RAD ONC ARIA SESSION SUMMARY
Course Elapsed Days: 6
Plan Fractions Treated to Date: 6
Plan Prescribed Dose Per Fraction: 2.67 Gy
Plan Total Fractions Prescribed: 15
Plan Total Prescribed Dose: 40.05 Gy
Reference Point Dosage Given to Date: 16.02 Gy
Reference Point Session Dosage Given: 2.67 Gy
Session Number: 6

## 2023-07-20 ENCOUNTER — Ambulatory Visit
Admission: RE | Admit: 2023-07-20 | Discharge: 2023-07-20 | Disposition: A | Discharge status: Home / Self Care | Payer: Medicare PPO | Source: Ambulatory Visit | Attending: Radiation Oncology | Admitting: Radiation Oncology

## 2023-07-20 ENCOUNTER — Other Ambulatory Visit: Payer: Self-pay

## 2023-07-20 DIAGNOSIS — Z510 Encounter for antineoplastic radiation therapy: Secondary | ICD-10-CM | POA: Diagnosis not present

## 2023-07-20 DIAGNOSIS — Z170 Estrogen receptor positive status [ER+]: Secondary | ICD-10-CM | POA: Diagnosis not present

## 2023-07-20 DIAGNOSIS — C50412 Malignant neoplasm of upper-outer quadrant of left female breast: Secondary | ICD-10-CM | POA: Diagnosis not present

## 2023-07-20 LAB — RAD ONC ARIA SESSION SUMMARY
Course Elapsed Days: 7
Plan Fractions Treated to Date: 7
Plan Prescribed Dose Per Fraction: 2.67 Gy
Plan Total Fractions Prescribed: 15
Plan Total Prescribed Dose: 40.05 Gy
Reference Point Dosage Given to Date: 18.69 Gy
Reference Point Session Dosage Given: 2.67 Gy
Session Number: 7

## 2023-07-21 ENCOUNTER — Other Ambulatory Visit: Payer: Self-pay

## 2023-07-21 ENCOUNTER — Ambulatory Visit
Admission: RE | Admit: 2023-07-21 | Discharge: 2023-07-21 | Disposition: A | Discharge status: Home / Self Care | Payer: Medicare PPO | Source: Ambulatory Visit | Attending: Radiation Oncology | Admitting: Radiation Oncology

## 2023-07-21 DIAGNOSIS — Z510 Encounter for antineoplastic radiation therapy: Secondary | ICD-10-CM | POA: Diagnosis not present

## 2023-07-21 DIAGNOSIS — Z170 Estrogen receptor positive status [ER+]: Secondary | ICD-10-CM | POA: Diagnosis not present

## 2023-07-21 DIAGNOSIS — C50412 Malignant neoplasm of upper-outer quadrant of left female breast: Secondary | ICD-10-CM | POA: Diagnosis not present

## 2023-07-21 LAB — RAD ONC ARIA SESSION SUMMARY
Course Elapsed Days: 8
Plan Fractions Treated to Date: 8
Plan Prescribed Dose Per Fraction: 2.67 Gy
Plan Total Fractions Prescribed: 15
Plan Total Prescribed Dose: 40.05 Gy
Reference Point Dosage Given to Date: 21.36 Gy
Reference Point Session Dosage Given: 2.67 Gy
Session Number: 8

## 2023-07-26 ENCOUNTER — Ambulatory Visit
Admission: RE | Admit: 2023-07-26 | Discharge: 2023-07-26 | Disposition: A | Discharge status: Home / Self Care | Payer: Medicare PPO | Source: Ambulatory Visit | Attending: Radiation Oncology | Admitting: Radiation Oncology

## 2023-07-26 ENCOUNTER — Other Ambulatory Visit: Payer: Self-pay

## 2023-07-26 DIAGNOSIS — Z510 Encounter for antineoplastic radiation therapy: Secondary | ICD-10-CM | POA: Diagnosis not present

## 2023-07-26 DIAGNOSIS — C50412 Malignant neoplasm of upper-outer quadrant of left female breast: Secondary | ICD-10-CM | POA: Insufficient documentation

## 2023-07-26 DIAGNOSIS — Z170 Estrogen receptor positive status [ER+]: Secondary | ICD-10-CM | POA: Diagnosis not present

## 2023-07-26 LAB — RAD ONC ARIA SESSION SUMMARY
Course Elapsed Days: 13
Plan Fractions Treated to Date: 9
Plan Prescribed Dose Per Fraction: 2.67 Gy
Plan Total Fractions Prescribed: 15
Plan Total Prescribed Dose: 40.05 Gy
Reference Point Dosage Given to Date: 24.03 Gy
Reference Point Session Dosage Given: 2.67 Gy
Session Number: 9

## 2023-07-27 ENCOUNTER — Other Ambulatory Visit: Payer: Self-pay

## 2023-07-27 ENCOUNTER — Ambulatory Visit
Admission: RE | Admit: 2023-07-27 | Discharge: 2023-07-27 | Disposition: A | Discharge status: Home / Self Care | Payer: Medicare PPO | Source: Ambulatory Visit | Attending: Radiation Oncology | Admitting: Radiation Oncology

## 2023-07-27 ENCOUNTER — Ambulatory Visit: Payer: Medicare PPO

## 2023-07-27 DIAGNOSIS — Z510 Encounter for antineoplastic radiation therapy: Secondary | ICD-10-CM | POA: Diagnosis not present

## 2023-07-27 DIAGNOSIS — Z170 Estrogen receptor positive status [ER+]: Secondary | ICD-10-CM | POA: Diagnosis not present

## 2023-07-27 DIAGNOSIS — C50412 Malignant neoplasm of upper-outer quadrant of left female breast: Secondary | ICD-10-CM | POA: Diagnosis not present

## 2023-07-27 LAB — RAD ONC ARIA SESSION SUMMARY
Course Elapsed Days: 14
Plan Fractions Treated to Date: 10
Plan Prescribed Dose Per Fraction: 2.67 Gy
Plan Total Fractions Prescribed: 15
Plan Total Prescribed Dose: 40.05 Gy
Reference Point Dosage Given to Date: 26.7 Gy
Reference Point Session Dosage Given: 2.67 Gy
Session Number: 10

## 2023-07-28 ENCOUNTER — Other Ambulatory Visit: Payer: Self-pay

## 2023-07-28 ENCOUNTER — Ambulatory Visit
Admission: RE | Admit: 2023-07-28 | Discharge: 2023-07-28 | Disposition: A | Discharge status: Home / Self Care | Payer: Medicare PPO | Source: Ambulatory Visit | Attending: Radiation Oncology | Admitting: Radiation Oncology

## 2023-07-28 DIAGNOSIS — C50412 Malignant neoplasm of upper-outer quadrant of left female breast: Secondary | ICD-10-CM | POA: Diagnosis not present

## 2023-07-28 DIAGNOSIS — Z510 Encounter for antineoplastic radiation therapy: Secondary | ICD-10-CM | POA: Diagnosis not present

## 2023-07-28 DIAGNOSIS — Z170 Estrogen receptor positive status [ER+]: Secondary | ICD-10-CM | POA: Diagnosis not present

## 2023-07-28 LAB — RAD ONC ARIA SESSION SUMMARY
Course Elapsed Days: 15
Plan Fractions Treated to Date: 11
Plan Prescribed Dose Per Fraction: 2.67 Gy
Plan Total Fractions Prescribed: 15
Plan Total Prescribed Dose: 40.05 Gy
Reference Point Dosage Given to Date: 29.37 Gy
Reference Point Session Dosage Given: 2.67 Gy
Session Number: 11

## 2023-07-29 ENCOUNTER — Ambulatory Visit
Admission: RE | Admit: 2023-07-29 | Discharge: 2023-07-29 | Disposition: A | Discharge status: Home / Self Care | Payer: Medicare PPO | Source: Ambulatory Visit | Attending: Radiation Oncology | Admitting: Radiation Oncology

## 2023-07-29 ENCOUNTER — Other Ambulatory Visit: Payer: Self-pay

## 2023-07-29 DIAGNOSIS — Z510 Encounter for antineoplastic radiation therapy: Secondary | ICD-10-CM | POA: Diagnosis not present

## 2023-07-29 DIAGNOSIS — C50412 Malignant neoplasm of upper-outer quadrant of left female breast: Secondary | ICD-10-CM | POA: Diagnosis not present

## 2023-07-29 DIAGNOSIS — Z170 Estrogen receptor positive status [ER+]: Secondary | ICD-10-CM | POA: Diagnosis not present

## 2023-07-29 LAB — RAD ONC ARIA SESSION SUMMARY
Course Elapsed Days: 16
Plan Fractions Treated to Date: 12
Plan Prescribed Dose Per Fraction: 2.67 Gy
Plan Total Fractions Prescribed: 15
Plan Total Prescribed Dose: 40.05 Gy
Reference Point Dosage Given to Date: 32.04 Gy
Reference Point Session Dosage Given: 2.67 Gy
Session Number: 12

## 2023-07-30 ENCOUNTER — Other Ambulatory Visit: Payer: Self-pay

## 2023-07-30 ENCOUNTER — Ambulatory Visit
Admission: RE | Admit: 2023-07-30 | Discharge: 2023-07-30 | Disposition: A | Discharge status: Home / Self Care | Payer: Medicare PPO | Source: Ambulatory Visit | Attending: Radiation Oncology | Admitting: Radiation Oncology

## 2023-07-30 DIAGNOSIS — Z170 Estrogen receptor positive status [ER+]: Secondary | ICD-10-CM | POA: Diagnosis not present

## 2023-07-30 DIAGNOSIS — Z510 Encounter for antineoplastic radiation therapy: Secondary | ICD-10-CM | POA: Diagnosis not present

## 2023-07-30 DIAGNOSIS — C50412 Malignant neoplasm of upper-outer quadrant of left female breast: Secondary | ICD-10-CM | POA: Diagnosis not present

## 2023-07-30 LAB — RAD ONC ARIA SESSION SUMMARY
Course Elapsed Days: 17
Plan Fractions Treated to Date: 13
Plan Prescribed Dose Per Fraction: 2.67 Gy
Plan Total Fractions Prescribed: 15
Plan Total Prescribed Dose: 40.05 Gy
Reference Point Dosage Given to Date: 34.71 Gy
Reference Point Session Dosage Given: 2.67 Gy
Session Number: 13

## 2023-08-02 ENCOUNTER — Other Ambulatory Visit: Payer: Self-pay

## 2023-08-02 ENCOUNTER — Ambulatory Visit
Admission: RE | Admit: 2023-08-02 | Discharge: 2023-08-02 | Disposition: A | Discharge status: Home / Self Care | Payer: Medicare PPO | Source: Ambulatory Visit | Attending: Radiation Oncology | Admitting: Radiation Oncology

## 2023-08-02 DIAGNOSIS — Z510 Encounter for antineoplastic radiation therapy: Secondary | ICD-10-CM | POA: Diagnosis not present

## 2023-08-02 DIAGNOSIS — Z170 Estrogen receptor positive status [ER+]: Secondary | ICD-10-CM | POA: Diagnosis not present

## 2023-08-02 DIAGNOSIS — C50412 Malignant neoplasm of upper-outer quadrant of left female breast: Secondary | ICD-10-CM | POA: Diagnosis not present

## 2023-08-02 LAB — RAD ONC ARIA SESSION SUMMARY
Course Elapsed Days: 20
Plan Fractions Treated to Date: 14
Plan Prescribed Dose Per Fraction: 2.67 Gy
Plan Total Fractions Prescribed: 15
Plan Total Prescribed Dose: 40.05 Gy
Reference Point Dosage Given to Date: 37.38 Gy
Reference Point Session Dosage Given: 2.67 Gy
Session Number: 14

## 2023-08-03 ENCOUNTER — Ambulatory Visit
Admission: RE | Admit: 2023-08-03 | Discharge: 2023-08-03 | Disposition: A | Discharge status: Home / Self Care | Payer: Medicare PPO | Source: Ambulatory Visit | Attending: Radiation Oncology | Admitting: Radiation Oncology

## 2023-08-03 ENCOUNTER — Other Ambulatory Visit: Payer: Self-pay

## 2023-08-03 ENCOUNTER — Ambulatory Visit: Payer: Medicare PPO | Admitting: Radiation Oncology

## 2023-08-03 DIAGNOSIS — Z170 Estrogen receptor positive status [ER+]: Secondary | ICD-10-CM | POA: Diagnosis not present

## 2023-08-03 DIAGNOSIS — Z510 Encounter for antineoplastic radiation therapy: Secondary | ICD-10-CM | POA: Diagnosis not present

## 2023-08-03 DIAGNOSIS — C50412 Malignant neoplasm of upper-outer quadrant of left female breast: Secondary | ICD-10-CM | POA: Diagnosis not present

## 2023-08-03 LAB — RAD ONC ARIA SESSION SUMMARY
Course Elapsed Days: 21
Plan Fractions Treated to Date: 15
Plan Prescribed Dose Per Fraction: 2.67 Gy
Plan Total Fractions Prescribed: 15
Plan Total Prescribed Dose: 40.05 Gy
Reference Point Dosage Given to Date: 40.05 Gy
Reference Point Session Dosage Given: 2.67 Gy
Session Number: 15

## 2023-08-04 ENCOUNTER — Other Ambulatory Visit: Payer: Self-pay

## 2023-08-04 ENCOUNTER — Ambulatory Visit
Admission: RE | Admit: 2023-08-04 | Discharge: 2023-08-04 | Disposition: A | Discharge status: Home / Self Care | Payer: Medicare PPO | Source: Ambulatory Visit | Attending: Radiation Oncology | Admitting: Radiation Oncology

## 2023-08-04 DIAGNOSIS — C50412 Malignant neoplasm of upper-outer quadrant of left female breast: Secondary | ICD-10-CM | POA: Diagnosis not present

## 2023-08-04 DIAGNOSIS — Z510 Encounter for antineoplastic radiation therapy: Secondary | ICD-10-CM | POA: Diagnosis not present

## 2023-08-04 DIAGNOSIS — Z170 Estrogen receptor positive status [ER+]: Secondary | ICD-10-CM | POA: Diagnosis not present

## 2023-08-04 LAB — RAD ONC ARIA SESSION SUMMARY
Course Elapsed Days: 22
Plan Fractions Treated to Date: 1
Plan Prescribed Dose Per Fraction: 2 Gy
Plan Total Fractions Prescribed: 6
Plan Total Prescribed Dose: 12 Gy
Reference Point Dosage Given to Date: 2 Gy
Reference Point Session Dosage Given: 2 Gy
Session Number: 16

## 2023-08-05 ENCOUNTER — Ambulatory Visit
Admission: RE | Admit: 2023-08-05 | Discharge: 2023-08-05 | Disposition: A | Discharge status: Home / Self Care | Payer: Medicare PPO | Source: Ambulatory Visit | Attending: Radiation Oncology | Admitting: Radiation Oncology

## 2023-08-05 ENCOUNTER — Other Ambulatory Visit: Payer: Self-pay

## 2023-08-05 DIAGNOSIS — C50412 Malignant neoplasm of upper-outer quadrant of left female breast: Secondary | ICD-10-CM | POA: Diagnosis not present

## 2023-08-05 DIAGNOSIS — Z510 Encounter for antineoplastic radiation therapy: Secondary | ICD-10-CM | POA: Diagnosis not present

## 2023-08-05 LAB — RAD ONC ARIA SESSION SUMMARY
Course Elapsed Days: 23
Plan Fractions Treated to Date: 2
Plan Prescribed Dose Per Fraction: 2 Gy
Plan Total Fractions Prescribed: 6
Plan Total Prescribed Dose: 12 Gy
Reference Point Dosage Given to Date: 4 Gy
Reference Point Session Dosage Given: 2 Gy
Session Number: 17

## 2023-08-06 ENCOUNTER — Other Ambulatory Visit: Payer: Self-pay

## 2023-08-06 ENCOUNTER — Ambulatory Visit
Admission: RE | Admit: 2023-08-06 | Discharge: 2023-08-06 | Disposition: A | Discharge status: Home / Self Care | Payer: Medicare PPO | Source: Ambulatory Visit | Attending: Radiation Oncology | Admitting: Radiation Oncology

## 2023-08-06 DIAGNOSIS — Z510 Encounter for antineoplastic radiation therapy: Secondary | ICD-10-CM | POA: Diagnosis not present

## 2023-08-06 DIAGNOSIS — C50412 Malignant neoplasm of upper-outer quadrant of left female breast: Secondary | ICD-10-CM | POA: Diagnosis not present

## 2023-08-06 LAB — RAD ONC ARIA SESSION SUMMARY
Course Elapsed Days: 24
Plan Fractions Treated to Date: 3
Plan Prescribed Dose Per Fraction: 2 Gy
Plan Total Fractions Prescribed: 6
Plan Total Prescribed Dose: 12 Gy
Reference Point Dosage Given to Date: 6 Gy
Reference Point Session Dosage Given: 2 Gy
Session Number: 18

## 2023-08-09 ENCOUNTER — Telehealth: Payer: Self-pay | Admitting: Radiation Oncology

## 2023-08-09 ENCOUNTER — Other Ambulatory Visit: Payer: Self-pay

## 2023-08-09 ENCOUNTER — Ambulatory Visit
Admission: RE | Admit: 2023-08-09 | Discharge: 2023-08-09 | Disposition: A | Discharge status: Home / Self Care | Payer: Medicare PPO | Source: Ambulatory Visit | Attending: Radiation Oncology | Admitting: Radiation Oncology

## 2023-08-09 DIAGNOSIS — Z510 Encounter for antineoplastic radiation therapy: Secondary | ICD-10-CM | POA: Diagnosis not present

## 2023-08-09 DIAGNOSIS — C50412 Malignant neoplasm of upper-outer quadrant of left female breast: Secondary | ICD-10-CM | POA: Diagnosis not present

## 2023-08-09 LAB — RAD ONC ARIA SESSION SUMMARY
Course Elapsed Days: 27
Plan Fractions Treated to Date: 4
Plan Prescribed Dose Per Fraction: 2 Gy
Plan Total Fractions Prescribed: 6
Plan Total Prescribed Dose: 12 Gy
Reference Point Dosage Given to Date: 8 Gy
Reference Point Session Dosage Given: 2 Gy
Session Number: 19

## 2023-08-09 NOTE — Telephone Encounter (Signed)
12/126 @ 12:20 pm patient called due to her Dome/pinkie ring is missing and she may have left ring in first dressing room for yellow machine.  Email sent to L3 machine and copied Support RTT, so they are aware.

## 2023-08-10 ENCOUNTER — Ambulatory Visit
Admission: RE | Admit: 2023-08-10 | Discharge: 2023-08-10 | Disposition: A | Discharge status: Home / Self Care | Payer: Medicare PPO | Source: Ambulatory Visit | Attending: Radiation Oncology | Admitting: Radiation Oncology

## 2023-08-10 ENCOUNTER — Ambulatory Visit
Admission: RE | Admit: 2023-08-10 | Discharge: 2023-08-10 | Discharge status: Home / Self Care | Payer: Medicare PPO | Source: Ambulatory Visit | Attending: Radiation Oncology | Admitting: Radiation Oncology

## 2023-08-10 ENCOUNTER — Other Ambulatory Visit: Payer: Self-pay

## 2023-08-10 DIAGNOSIS — Z510 Encounter for antineoplastic radiation therapy: Secondary | ICD-10-CM | POA: Diagnosis not present

## 2023-08-10 DIAGNOSIS — C50412 Malignant neoplasm of upper-outer quadrant of left female breast: Secondary | ICD-10-CM | POA: Diagnosis not present

## 2023-08-10 LAB — RAD ONC ARIA SESSION SUMMARY
Course Elapsed Days: 28
Plan Fractions Treated to Date: 5
Plan Prescribed Dose Per Fraction: 2 Gy
Plan Total Fractions Prescribed: 6
Plan Total Prescribed Dose: 12 Gy
Reference Point Dosage Given to Date: 10 Gy
Reference Point Session Dosage Given: 2 Gy
Session Number: 20

## 2023-08-11 ENCOUNTER — Inpatient Hospital Stay: Payer: Medicare PPO | Admitting: Hematology and Oncology

## 2023-08-11 ENCOUNTER — Other Ambulatory Visit: Payer: Self-pay

## 2023-08-11 ENCOUNTER — Ambulatory Visit
Admission: RE | Admit: 2023-08-11 | Discharge: 2023-08-11 | Disposition: A | Discharge status: Home / Self Care | Payer: Medicare PPO | Source: Ambulatory Visit | Attending: Radiation Oncology | Admitting: Radiation Oncology

## 2023-08-11 VITALS — BP 130/66 | HR 77 | Temp 98.0°F | Resp 18 | Ht 65.0 in | Wt 162.1 lb

## 2023-08-11 DIAGNOSIS — Z170 Estrogen receptor positive status [ER+]: Secondary | ICD-10-CM | POA: Insufficient documentation

## 2023-08-11 DIAGNOSIS — Z79811 Long term (current) use of aromatase inhibitors: Secondary | ICD-10-CM | POA: Insufficient documentation

## 2023-08-11 DIAGNOSIS — M8580 Other specified disorders of bone density and structure, unspecified site: Secondary | ICD-10-CM | POA: Insufficient documentation

## 2023-08-11 DIAGNOSIS — Z79899 Other long term (current) drug therapy: Secondary | ICD-10-CM | POA: Insufficient documentation

## 2023-08-11 DIAGNOSIS — Z923 Personal history of irradiation: Secondary | ICD-10-CM | POA: Insufficient documentation

## 2023-08-11 DIAGNOSIS — C50412 Malignant neoplasm of upper-outer quadrant of left female breast: Secondary | ICD-10-CM | POA: Insufficient documentation

## 2023-08-11 DIAGNOSIS — Z510 Encounter for antineoplastic radiation therapy: Secondary | ICD-10-CM | POA: Diagnosis not present

## 2023-08-11 LAB — RAD ONC ARIA SESSION SUMMARY
Course Elapsed Days: 29
Plan Fractions Treated to Date: 6
Plan Prescribed Dose Per Fraction: 2 Gy
Plan Total Fractions Prescribed: 6
Plan Total Prescribed Dose: 12 Gy
Reference Point Dosage Given to Date: 12 Gy
Reference Point Session Dosage Given: 2 Gy
Session Number: 21

## 2023-08-11 MED ORDER — ANASTROZOLE 1 MG PO TABS: 1.0000 mg | ORAL_TABLET | Freq: Every day | ORAL | 3 refills | Status: DC

## 2023-08-11 NOTE — Progress Notes (Signed)
Patient Care Team: Georgann Housekeeper, MD as PCP - General (Internal Medicine) Pershing Proud, RN as Oncology Nurse Navigator Donnelly Angelica, RN as Oncology Nurse Navigator Serena Croissant, MD as Consulting Physician (Hematology and Oncology) Antony Blackbird, MD as Consulting Physician (Radiation Oncology) Griselda Miner, MD as Consulting Physician (General Surgery)  DIAGNOSIS:  Encounter Diagnosis  Name Primary?   Malignant neoplasm of upper-outer quadrant of left breast in female, estrogen receptor positive (HCC) Yes    SUMMARY OF ONCOLOGIC HISTORY: Oncology History  Malignant neoplasm of upper-outer quadrant of left breast in female, estrogen receptor positive (HCC)  05/12/2023 Initial Diagnosis   Screening mammogram detected screening mammogram detected left breast asymmetry/architectural distortion which was palpable measuring 0.8 cm, axilla negative, biopsy grade 1 IDC ER 100%, PR 100%, Ki67 2%, HER2 1+ negative   05/19/2023 Cancer Staging   Staging form: Breast, AJCC 8th Edition - Clinical stage from 05/19/2023: Stage IA (cT1b, cN0, cM0, G1, ER+, PR+, HER2-) - Signed by Serena Croissant, MD on 05/19/2023 Stage prefix: Initial diagnosis Histologic grading system: 3 grade system Laterality: Left Staged by: Pathologist and managing physician Stage used in treatment planning: Yes National guidelines used in treatment planning: Yes Type of national guideline used in treatment planning: NCCN   06/02/2023 Surgery   Left lumpectomy: Grade 1 IDC 1.1 cm, Marg negative for lymphovascular invasion ER 100%, PR 100%, HER2 1+ negative, Ki-67 2%   06/08/2023 Genetic Testing   Negative [facility] CancerNext-Expanded +RNAinsight Panel.  Report date is 06/08/2023.   The CancerNext-Expanded gene panel offered by [facility] and includes sequencing, rearrangement, and RNA analysis for the following 71 genes:  AIP, ALK, APC, ATM, BAP1, BARD1, BMPR1A, BRCA1, BRCA2, BRIP1, CDC73, CDH1, CDK4, CDKN1B, CDKN2A,  CHEK2, DICER1, FH, FLCN, KIF1B, LZTR1,MAX, MEN1, MET, MLH1, MSH2, MSH6, MUTYH, NF1, NF2, NTHL1, PALB2, PHOX2B, PMS2, POT1, PRKAR1A, PTCH1, PTEN, RAD51C,RAD51D, RB1, RET, SDHA, SDHAF2, SDHB, SDHC, SDHD, SMAD4, SMARCA4, SMARCB1, SMARCE1, STK11, SUFU, TMEM127, TP53,TSC1, TSC2 and VHL (sequencing and deletion/duplication); AXIN2, CTNNA1, EGFR, EGLN1, HOXB13, KIT, MITF, MSH3, PDGFRA, POLD1 and POLE (sequencing only); EPCAM and GREM1 (deletion/duplication only).    06/23/2023 Oncotype testing   oncotype results of 7/3%    07/14/2023 - 08/11/2023 Radiation Therapy   Adjuvant radiation     CHIEF COMPLIANT: Follow-up after radiation is complete  HISTORY OF PRESENT ILLNESS:   History of Present Illness   The patient, with a history of breast cancer, has recently completed radiation therapy. She reports skin changes and discomfort in the arm, which she attributes to the radiation therapy. She also mentions a sore throat, but it is unclear if this is related to the radiation therapy or another cause. The patient also has osteopenia, for which she has had a bone density scan this year. She is currently managing this condition with swimming, but is advised to add weight-bearing exercises to her routine. The patient also mentions taking Vitamin D3 and is advised to continue this supplement to support bone health.         ALLERGIES:  is allergic to amoxicillin-pot clavulanate and statins.  MEDICATIONS:  Current Outpatient Medications  Medication Sig Dispense Refill   anastrozole (ARIMIDEX) 1 MG tablet Take 1 tablet (1 mg total) by mouth daily. 90 tablet 3   ALPRAZolam (XANAX) 0.5 MG tablet Take 0.5 mg by mouth 2 (two) times daily as needed for anxiety or sleep.     Biotin 5000 MCG CAPS Take 1 capsule by mouth every other day.  citalopram (CELEXA) 20 MG tablet Take 20 mg by mouth at bedtime.     fluconazole (DIFLUCAN) 150 MG tablet Take 1 tablet (150 mg total) by mouth as needed. For yeast 10 tablet 1    influenza vaccine adjuvanted (FLUAD) 0.5 ML injection Inject into the muscle. 0.5 mL 0   levothyroxine (SYNTHROID) 100 MCG tablet Take 100 mcg by mouth at bedtime. PT ONLY TAKES MON-FRI     Niacin (VITAMIN B-3 PO) Take 2,000 Int'l Units/L by mouth daily.     pantoprazole (PROTONIX) 20 MG tablet Take 20 mg by mouth at bedtime.     pravastatin (PRAVACHOL) 40 MG tablet Take 40 mg by mouth daily. Takes at bedtime     Turmeric 500 MG CAPS Take by mouth.     No current facility-administered medications for this visit.    PHYSICAL EXAMINATION: ECOG PERFORMANCE STATUS: 1 - Symptomatic but completely ambulatory  Vitals:   08/11/23 1114  BP: 130/66  Pulse: 77  Resp: 18  Temp: 98 F (36.7 C)  SpO2: 98%   Filed Weights   08/11/23 1114  Weight: 162 lb 1.6 oz (73.5 kg)    Physical Exam          (exam performed in the presence of a chaperone)  LABORATORY DATA:  I have reviewed the data as listed    Latest Ref Rng & Units 05/19/2023   12:38 PM 10/03/2015   12:26 PM  CMP  Glucose 70 - 99 mg/dL 86  86   BUN 8 - 23 mg/dL 14  12   Creatinine 1.30 - 1.00 mg/dL 8.65  7.84   Sodium 696 - 145 mmol/L 137  139   Potassium 3.5 - 5.1 mmol/L 4.2  4.9   Chloride 98 - 111 mmol/L 105  103   CO2 22 - 32 mmol/L 27  24   Calcium 8.9 - 10.3 mg/dL 9.5  9.6   Total Protein 6.5 - 8.1 g/dL 7.1    Total Bilirubin 0.3 - 1.2 mg/dL 0.5    Alkaline Phos 38 - 126 U/L 83    AST 15 - 41 U/L 14    ALT 0 - 44 U/L 10      Lab Results  Component Value Date   WBC 5.7 05/19/2023   HGB 13.4 05/19/2023   HCT 40.8 05/19/2023   MCV 92.3 05/19/2023   PLT 282 05/19/2023   NEUTROABS 3.1 05/19/2023    ASSESSMENT & PLAN:  Malignant neoplasm of upper-outer quadrant of left breast in female, estrogen receptor positive (HCC) 06/02/2023:Left lumpectomy: Grade 1 IDC 1.1 cm, Marg negative for lymphovascular invasion ER 100%, PR 100%, HER2 1+ negative, Ki-67 2% oncotype results of 7/3%    Treatment plan: Adjuvant  radiation 07/14/2023-08/11/2023 Adjuvant antiestrogen therapy with anastrozole 1 mg daily to start 08/25/2023   Anastrozole counseling: We discussed the risks and benefits of anti-estrogen therapy with aromatase inhibitors. These include but not limited to insomnia, hot flashes, mood changes, vaginal dryness, bone density loss, and weight gain. We strongly believe that the benefits far outweigh the risks. Patient understands these risks and consented to starting treatment. Planned treatment duration is 7 years.  Return to clinic in 3 months for survivorship care plan visit  ------------------------------------- Assessment and Plan    Breast Cancer Post-Radiation Completed radiation therapy with mild skin changes and discomfort. Noted some tingling and sharp jabs, likely due to nerve sensitivity post-radiation. -Expect healing within two weeks. -Continue monitoring skin changes and report any significant changes  or increased discomfort.  Hormone Therapy Discussed initiation of anastrozole 1mg  daily for 5-7 years to reduce estrogen production and prevent recurrence of breast cancer. -Start anastrozole 1mg  daily from August 25, 2023. -Monitor for side effects including hot flashes and joint stiffness. -Encourage regular exercise and intake of Vitamin D3 2000 international units daily for bone health. -Consider calcium supplementation depending on dietary intake. -Schedule bone density tests every two years to monitor for osteoporosis.  Follow-up Scheduled for a three-month follow-up appointment with the nurse practitioner for a survivorship discussion and to set up a roadmap for the next five years. -Schedule mammograms and other necessary tests during the follow-up appointment. -Subsequent follow-up with the doctor in six months and then annually.          No orders of the defined types were placed in this encounter.  The patient has a good understanding of the overall plan. she agrees  with it. she will call with any problems that may develop before the next visit here. Total time spent: 30 mins including face to face time and time spent for planning, charting and co-ordination of care   Tamsen Meek, MD 08/11/23

## 2023-08-11 NOTE — Assessment & Plan Note (Addendum)
06/02/2023:Left lumpectomy: Grade 1 IDC 1.1 cm, Marg negative for lymphovascular invasion ER 100%, PR 100%, HER2 1+ negative, Ki-67 2% oncotype results of 7/3%    Treatment plan: Adjuvant radiation 07/14/2023-08/11/2023 Adjuvant antiestrogen therapy with anastrozole 1 mg daily to start 08/25/2023   Anastrozole counseling: We discussed the risks and benefits of anti-estrogen therapy with aromatase inhibitors. These include but not limited to insomnia, hot flashes, mood changes, vaginal dryness, bone density loss, and weight gain. We strongly believe that the benefits far outweigh the risks. Patient understands these risks and consented to starting treatment. Planned treatment duration is 7 years.  Return to clinic in 3 months for survivorship care plan visit

## 2023-08-12 ENCOUNTER — Ambulatory Visit: Payer: Medicare PPO

## 2023-08-12 NOTE — Radiation Completion Notes (Signed)
Patient Name: Teresa Love, Teresa Love MRN: [id number] Date of Birth: Dec 30, 1955 Referring Physician: Georgann Housekeeper, M.D. Date of Service: 2023-08-12 Radiation Oncologist: Arnette Schaumann, M.D. [facility] [place]                             RADIATION ONCOLOGY END OF TREATMENT NOTE     Diagnosis: C50.412 Malignant neoplasm of upper-outer quadrant of left female breast Staging on 2023-05-19: Malignant neoplasm of upper-outer quadrant of left breast in female, estrogen receptor positive (HCC) T=cT1b, N=cN0, M=cM0 Intent: Curative     ==========DELIVERED PLANS==========  First Treatment Date: 2023-07-13 Last Treatment Date: 2023-08-11   Plan Name: Breast_L_BH Site: Breast, Left Technique: 3D Mode: Photon Dose Per Fraction: 2.67 Gy Prescribed Dose (Delivered / Prescribed): 40.05 Gy / 40.05 Gy Prescribed Fxs (Delivered / Prescribed): 15 / 15   Plan Name: Brst_L_Bst_BH Site: Breast, Left Technique: 3D Mode: Photon Dose Per Fraction: 2 Gy Prescribed Dose (Delivered / Prescribed): 12 Gy / 12 Gy Prescribed Fxs (Delivered / Prescribed): 6 / 6     ==========ON TREATMENT VISIT DATES========== 2023-07-13, 2023-07-20, 2023-08-03, 2023-08-10     ==========UPCOMING VISITS========== 09/16/2023 [facility]-RADIATION ONC FOLLOW UP 15 Antony Blackbird, MD        ==========APPENDIX - ON TREATMENT VISIT NOTES==========   See weekly On Treatment Notes in Epic for details in the Media tab (listed as Progress notes on the On Treatment Visit Dates listed above).

## 2023-08-23 DIAGNOSIS — B9689 Other specified bacterial agents as the cause of diseases classified elsewhere: Secondary | ICD-10-CM | POA: Diagnosis not present

## 2023-08-23 DIAGNOSIS — J208 Acute bronchitis due to other specified organisms: Secondary | ICD-10-CM | POA: Diagnosis not present

## 2023-09-01 DIAGNOSIS — M7061 Trochanteric bursitis, right hip: Secondary | ICD-10-CM | POA: Diagnosis not present

## 2023-09-14 ENCOUNTER — Encounter: Payer: Self-pay | Admitting: Radiation Oncology

## 2023-09-15 NOTE — Progress Notes (Signed)
  Radiation Oncology         [phone]) [phone] ________________________________  Name: Teresa Love MRN: [id number]  Date: 09/16/2023  DOB: 10-02-1955  End of Treatment Note  Diagnosis:   Malignant neoplasm of upper-outer quadrant of left breast in female, estrogen receptor positive (HCC) T=cT1b, N=cN0, M=cM0      Indication for treatment:  Curative        Radiation treatment dates:   First Treatment Date: 2023-07-13 Last Treatment Date: 2023-08-11  Site/Dose/Technique/Mode:  Plan Name: Breast_L_BH Site: Breast, Left Technique: 3D Mode: Photon Dose Per Fraction: 2.67 Gy Prescribed Dose (Delivered / Prescribed): 40.05 Gy / 40.05 Gy Prescribed Fxs (Delivered / Prescribed): 15 / 15   Plan Name: Brst_L_Bst_BH Site: Breast, Left Technique: 3D Mode: Photon Dose Per Fraction: 2 Gy Prescribed Dose (Delivered / Prescribed): 12 Gy / 12 Gy Prescribed Fxs (Delivered / Prescribed): 6 / 6   Narrative: The patient tolerated radiation treatment relatively well. She did however experienced tingling in the left breast along with feeling fatigued. She also reported red, itchy, and dry skin. Hyperpigmentation to the breast and significant radiation dermatitis was noted on physical exam.    Plan: The patient has completed radiation treatment. The patient will return to radiation oncology clinic for routine followup in one month. I advised them to call or return sooner if they have any questions or concerns related to their recovery or treatment.  -----------------------------------  Billie Lade, PhD, MD  This document serves as a record of services personally performed by Antony Blackbird, MD. It was created on his behalf by Herbie Saxon, a trained medical scribe. The creation of this record is based on the scribe's personal observations and the provider's statements to them. This document has been checked and approved by the attending provider.

## 2023-09-15 NOTE — Progress Notes (Signed)
Radiation Oncology         [phone]) [phone] ________________________________  Name: Teresa Love MRN: [id number]  Date: 09/16/2023  DOB: 12-Aug-1956  Follow-Up Visit Note  CC: Teresa Housekeeper, MD  Teresa Housekeeper, MD  No diagnosis found.  Diagnosis:  Malignant neoplasm of upper-outer quadrant of left breast in female, estrogen receptor positive (HCC) T=cT1b, N=cN0, M=cM0   Indication for treatment:  Curative   Interval Since Last Radiation:  1 month 6 days  Radiation treatment dates:   First Treatment Date: 2023-07-13 Last Treatment Date: 2023-08-11   Site/Dose/Technique/Mode:  Plan Name: Breast_L_BH Site: Breast, Left Technique: 3D Mode: Photon Dose Per Fraction: 2.67 Gy Prescribed Dose (Delivered / Prescribed): 40.05 Gy / 40.05 Gy Prescribed Fxs (Delivered / Prescribed): 15 / 15  Narrative:  The patient returns today for routine follow-up. She was last seen in office on  06-30-23. Since then she completed radiation treatment which she tolerated well. She presented for a follow-up with Dr. Trudee Kuster on 08-11-23. At that time, she complained of skin changes and discomfort in the arm which she attributed to radiation treatment. Otherwise feeling well overall.  No other significant oncologic interval history since the patient was last seen.                               Allergies:  is allergic to amoxicillin-pot clavulanate and statins.  Meds: Current Outpatient Medications  Medication Sig Dispense Refill   ALPRAZolam (XANAX) 0.5 MG tablet Take 0.5 mg by mouth 2 (two) times daily as needed for anxiety or sleep.     anastrozole (ARIMIDEX) 1 MG tablet Take 1 tablet (1 mg total) by mouth daily. 90 tablet 3   Biotin 5000 MCG CAPS Take 1 capsule by mouth every other day.     citalopram (CELEXA) 20 MG tablet Take 20 mg by mouth at bedtime.     fluconazole (DIFLUCAN) 150 MG tablet Take 1 tablet (150 mg total) by mouth as needed. For yeast 10 tablet 1   influenza vaccine adjuvanted  (FLUAD) 0.5 ML injection Inject into the muscle. 0.5 mL 0   levothyroxine (SYNTHROID) 100 MCG tablet Take 100 mcg by mouth at bedtime. PT ONLY TAKES MON-FRI     Niacin (VITAMIN B-3 PO) Take 2,000 Int'l Units/L by mouth daily.     pantoprazole (PROTONIX) 20 MG tablet Take 20 mg by mouth at bedtime.     pravastatin (PRAVACHOL) 40 MG tablet Take 40 mg by mouth daily. Takes at bedtime     Turmeric 500 MG CAPS Take by mouth.     No current facility-administered medications for this encounter.    Physical Findings: The patient is in no acute distress. Patient is alert and oriented.  vitals were not taken for this visit. .  No significant changes. Lungs are clear to auscultation bilaterally. Heart has regular rate and rhythm. No palpable cervical, supraclavicular, or axillary adenopathy. Abdomen soft, non-tender, normal bowel sounds.   Lab Findings: Lab Results  Component Value Date   WBC 5.7 05/19/2023   HGB 13.4 05/19/2023   HCT 40.8 05/19/2023   MCV 92.3 05/19/2023   PLT 282 05/19/2023    Radiographic Findings: No results found.  Impression:  Malignant neoplasm of upper-outer quadrant of left breast in female, estrogen receptor positive (HCC) T=cT1b, N=cN0, M=cM0   The patient is recovering from the effects of radiation.  ***  Plan:  ***   *** minutes of  total time was spent for this patient encounter, including preparation, face-to-face counseling with the patient and coordination of care, physical exam, and documentation of the encounter. ____________________________________  Billie Lade, PhD, MD  This document serves as a record of services personally performed by Antony Blackbird, MD. It was created on his behalf by Herbie Saxon, a trained medical scribe. The creation of this record is based on the scribe's personal observations and the provider's statements to them. This document has been checked and approved by the attending provider.

## 2023-09-16 ENCOUNTER — Ambulatory Visit
Admission: RE | Admit: 2023-09-16 | Discharge: 2023-09-16 | Disposition: A | Discharge status: Home / Self Care | Payer: Medicare PPO | Source: Ambulatory Visit | Attending: Radiation Oncology | Admitting: Radiation Oncology

## 2023-09-16 VITALS — BP 123/76 | HR 70 | Temp 97.8°F | Resp 18 | Ht 65.0 in | Wt 161.0 lb

## 2023-09-16 DIAGNOSIS — L598 Other specified disorders of the skin and subcutaneous tissue related to radiation: Secondary | ICD-10-CM | POA: Diagnosis not present

## 2023-09-16 DIAGNOSIS — Z170 Estrogen receptor positive status [ER+]: Secondary | ICD-10-CM | POA: Diagnosis not present

## 2023-09-16 DIAGNOSIS — C50412 Malignant neoplasm of upper-outer quadrant of left female breast: Secondary | ICD-10-CM | POA: Diagnosis not present

## 2023-09-16 DIAGNOSIS — Z923 Personal history of irradiation: Secondary | ICD-10-CM | POA: Diagnosis not present

## 2023-09-16 HISTORY — DX: Personal history of irradiation: Z92.3

## 2023-09-16 NOTE — Progress Notes (Signed)
Teresa Love is here today for follow up post radiation to the breast.   Breast Side:Left   They completed their radiation on: 08/11/23   Does the patient complain of any of the following: Post radiation skin issues: No Breast Tenderness: No Breast Swelling: No Lymphadema: No Range of Motion limitations: No Fatigue post radiation: No Appetite good/fair/poor:   Additional comments if applicable: Patient started anstrozole [name]. 1 2025. Denies any side effects at this time.   BP 123/76 (BP Location: Left Arm, Patient Position: Sitting, Cuff Size: Normal)   Pulse 70   Temp 97.8 F (36.6 C)   Resp 18   Ht 5\' 5"  (1.651 m)   Wt 161 lb (73 kg)   SpO2 100%   BMI 26.79 kg/m

## 2023-10-18 DIAGNOSIS — N958 Other specified menopausal and perimenopausal disorders: Secondary | ICD-10-CM | POA: Diagnosis not present

## 2023-11-09 DIAGNOSIS — J309 Allergic rhinitis, unspecified: Secondary | ICD-10-CM | POA: Diagnosis not present

## 2023-11-09 DIAGNOSIS — R0981 Nasal congestion: Secondary | ICD-10-CM | POA: Diagnosis not present

## 2023-11-11 ENCOUNTER — Telehealth: Payer: Self-pay | Admitting: Adult Health

## 2023-11-11 NOTE — Telephone Encounter (Signed)
 Rescheduled appointment per provider PAL. Talked with the patient and she is aware of the changes made to her upcoming appointment.

## 2023-11-12 ENCOUNTER — Inpatient Hospital Stay: Payer: Medicare PPO | Admitting: Adult Health

## 2023-11-22 ENCOUNTER — Telehealth: Payer: Self-pay | Admitting: *Deleted

## 2023-11-22 ENCOUNTER — Inpatient Hospital Stay: Attending: Radiation Oncology | Admitting: Adult Health

## 2023-11-22 ENCOUNTER — Other Ambulatory Visit: Payer: Self-pay

## 2023-11-22 VITALS — BP 132/75 | HR 77 | Temp 97.3°F | Resp 17 | Wt 163.3 lb

## 2023-11-22 DIAGNOSIS — Z923 Personal history of irradiation: Secondary | ICD-10-CM | POA: Diagnosis not present

## 2023-11-22 DIAGNOSIS — Z79811 Long term (current) use of aromatase inhibitors: Secondary | ICD-10-CM | POA: Diagnosis not present

## 2023-11-22 DIAGNOSIS — Z1732 Human epidermal growth factor receptor 2 negative status: Secondary | ICD-10-CM | POA: Insufficient documentation

## 2023-11-22 DIAGNOSIS — Z170 Estrogen receptor positive status [ER+]: Secondary | ICD-10-CM | POA: Diagnosis not present

## 2023-11-22 DIAGNOSIS — C50412 Malignant neoplasm of upper-outer quadrant of left female breast: Secondary | ICD-10-CM

## 2023-11-22 DIAGNOSIS — Z1721 Progesterone receptor positive status: Secondary | ICD-10-CM | POA: Insufficient documentation

## 2023-11-22 NOTE — Telephone Encounter (Signed)
 Bone Density faxed to [facility]. Confirmation received

## 2023-11-22 NOTE — Progress Notes (Unsigned)
 SURVIVORSHIP VISIT:  BRIEF ONCOLOGIC HISTORY:  Oncology History  Malignant neoplasm of upper-outer quadrant of left breast in female, estrogen receptor positive (HCC)  05/12/2023 Initial Diagnosis   Screening mammogram detected screening mammogram detected left breast asymmetry/architectural distortion which was palpable measuring 0.8 cm, axilla negative, biopsy grade 1 IDC ER 100%, PR 100%, Ki67 2%, HER2 1+ negative   05/19/2023 Cancer Staging   Staging form: Breast, AJCC 8th Edition - Clinical stage from 05/19/2023: Stage IA (cT1b, cN0, cM0, G1, ER+, PR+, HER2-) - Signed by Serena Croissant, MD on 05/19/2023 Stage prefix: Initial diagnosis Histologic grading system: 3 grade system Laterality: Left Staged by: Pathologist and managing physician Stage used in treatment planning: Yes National guidelines used in treatment planning: Yes Type of national guideline used in treatment planning: NCCN   06/02/2023 Surgery   Left lumpectomy: Grade 1 IDC 1.1 cm, Marg negative for lymphovascular invasion ER 100%, PR 100%, HER2 1+ negative, Ki-67 2%   06/08/2023 Genetic Testing   Negative [facility] CancerNext-Expanded +RNAinsight Panel.  Report date is 06/08/2023.   The CancerNext-Expanded gene panel offered by [facility] and includes sequencing, rearrangement, and RNA analysis for the following 71 genes:  AIP, ALK, APC, ATM, BAP1, BARD1, BMPR1A, BRCA1, BRCA2, BRIP1, CDC73, CDH1, CDK4, CDKN1B, CDKN2A, CHEK2, DICER1, FH, FLCN, KIF1B, LZTR1,MAX, MEN1, MET, MLH1, MSH2, MSH6, MUTYH, NF1, NF2, NTHL1, PALB2, PHOX2B, PMS2, POT1, PRKAR1A, PTCH1, PTEN, RAD51C,RAD51D, RB1, RET, SDHA, SDHAF2, SDHB, SDHC, SDHD, SMAD4, SMARCA4, SMARCB1, SMARCE1, STK11, SUFU, TMEM127, TP53,TSC1, TSC2 and VHL (sequencing and deletion/duplication); AXIN2, CTNNA1, EGFR, EGLN1, HOXB13, KIT, MITF, MSH3, PDGFRA, POLD1 and POLE (sequencing only); EPCAM and GREM1 (deletion/duplication only).    06/23/2023 Oncotype testing   oncotype results of 7/3%     07/14/2023 - 08/11/2023 Radiation Therapy   Plan Name: Breast_L_BH Site: Breast, Left Technique: 3D Mode: Photon Dose Per Fraction: 2.67 Gy Prescribed Dose (Delivered / Prescribed): 40.05 Gy / 40.05 Gy Prescribed Fxs (Delivered / Prescribed): 15 / 15   Plan Name: Brst_L_Bst_BH Site: Breast, Left Technique: 3D Mode: Photon Dose Per Fraction: 2 Gy Prescribed Dose (Delivered / Prescribed): 12 Gy / 12 Gy Prescribed Fxs (Delivered / Prescribed): 6 / 6   08/2023 -  Anti-estrogen oral therapy   1 mg Anastrozole daily x 5-7 years     INTERVAL HISTORY:  Teresa Love to review her survivorship care plan detailing her treatment course for breast cancer, as well as monitoring long-term side effects of that treatment, education regarding health maintenance, screening, and overall wellness and health promotion.     Overall, Teresa Love reports feeling quite well   REVIEW OF SYSTEMS:  Review of Systems - Oncology Breast: Denies any new nodularity, masses, tenderness, nipple changes, or nipple discharge.       PAST MEDICAL/SURGICAL HISTORY:  Past Medical History:  Diagnosis Date  . Anxiety   . Cancer (HCC) 04/2023   left breast IDC  . Depression   . GERD (gastroesophageal reflux disease)    occ, not a problem  . Hemorrhoids, internal, with bleeding   . History of radiation therapy    Left breast- 07/13/23-08/11/23- Dr. Antony Blackbird  . Hyperlipidemia   . Hypothyroidism   . PONV (postoperative nausea and vomiting)    Past Surgical History:  Procedure Laterality Date  . APPENDECTOMY  1975  . BREAST LUMPECTOMY WITH RADIOACTIVE SEED LOCALIZATION Left 06/02/2023   Procedure: LEFT BREAST LUMPECTOMY WITH RADIOACTIVE SEED LOCALIZATION;  Surgeon: Griselda Miner, MD;  Location: [facility];  Service: General;  Laterality: Left;  . BREAST SURGERY     Reduction  . CARDIAC CATHETERIZATION N/A 10/07/2015   Procedure: Left Heart Cath and Coronary Angiography;  Surgeon: Lyn Records, MD;  Location[facility] INVASIVE CV LAB;  Service: Cardiovascular;  Laterality: N/A;  . COLONOSCOPY WITH PROPOFOL N/A 06/23/2016   Procedure: COLONOSCOPY WITH PROPOFOL;  Surgeon: Charolett Bumpers, MD;  Location: [facility];  Service: Endoscopy;  Laterality: N/A;  . GYNECOLOGIC CRYOSURGERY  age 47     ALLERGIES:  Allergies  Allergen Reactions  . Amoxicillin-Pot Clavulanate Diarrhea    Other Reaction(s): Unknown  . Statins Other (See Comments)    Muscle aches     CURRENT MEDICATIONS:  Outpatient Encounter Medications as of 11/22/2023  Medication Sig  . ALPRAZolam (XANAX) 0.5 MG tablet Take 0.5 mg by mouth 2 (two) times daily as needed for anxiety or sleep.  Marland Kitchen anastrozole (ARIMIDEX) 1 MG tablet Take 1 tablet (1 mg total) by mouth daily.  . Biotin 5000 MCG CAPS Take 1 capsule by mouth 3 (three) times a week.  . citalopram (CELEXA) 20 MG tablet Take 20 mg by mouth at bedtime.  . fluconazole (DIFLUCAN) 150 MG tablet Take 1 tablet (150 mg total) by mouth as needed. For yeast  . influenza vaccine adjuvanted (FLUAD) 0.5 ML injection Inject into the muscle.  . levothyroxine (SYNTHROID) 100 MCG tablet Take 100 mcg by mouth at bedtime. PT ONLY TAKES MON-FRI  . Niacin (VITAMIN B-3 PO) Take 2,000 Int'l Units/L by mouth daily.  . pantoprazole (PROTONIX) 20 MG tablet Take 20 mg by mouth at bedtime.  . pravastatin (PRAVACHOL) 40 MG tablet Take 40 mg by mouth daily. Takes at bedtime  . Turmeric 500 MG CAPS Take by mouth.   No facility-administered encounter medications on file as of 11/22/2023.     ONCOLOGIC FAMILY HISTORY:  Family History  Problem Relation Age of Onset  . Cancer Mother        Breast and Kidney cancer  . Hypertension Mother   . Breast cancer Mother 27  . Kidney cancer Mother 47  . Heart disease Father   . Hypertension Father   . Breast cancer Paternal Aunt 32  . Tuberculosis Maternal Grandmother      SOCIAL HISTORY:  Social History   Socioeconomic History  .  Marital status: Married    Spouse name: Not on file  . Number of children: 2  . Years of education: Not on file  . Highest education level: Not on file  Occupational History    Employer: RETIRED  Tobacco Use  . Smoking status: Never  . Smokeless tobacco: Never  Vaping Use  . Vaping status: Never Used  Substance and Sexual Activity  . Alcohol use: Yes    Alcohol/week: 1.0 standard drink of alcohol    Types: 1 Standard drinks or equivalent per week    Comment: SOCIAL  . Drug use: No  . Sexual activity: Not Currently    Birth control/protection: Post-menopausal    Comment: 1st intercourse 60 yo-1 partner  Other Topics Concern  . Not on file  Social History Narrative  . Not on file   Social Drivers of Health   Financial Resource Strain: Not on file  Food Insecurity: No Food Insecurity (06/30/2023)   Hunger Vital Sign   . Worried About Programme researcher, broadcasting/film/video in the Last Year: Never true   . Ran Out of Food in the Last Year: Never true  Transportation Needs: No Transportation Needs (  06/30/2023)   PRAPARE - Transportation   . Lack of Transportation (Medical): No   . Lack of Transportation (Non-Medical): No  Physical Activity: Not on file  Stress: Not on file  Social Connections: Not on file  Intimate Partner Violence: Not At Risk (06/30/2023)   Humiliation, Afraid, Rape, and Kick questionnaire   . Fear of Current or Ex-Partner: No   . Emotionally Abused: No   . Physically Abused: No   . Sexually Abused: No     OBSERVATIONS/OBJECTIVE:  BP 132/75 (BP Location: Left Arm, Patient Position: Sitting)   Pulse 77   Temp (!) 97.3 F (36.3 C) (Temporal)   Resp 17   Wt 163 lb 4.8 oz (74.1 kg)   SpO2 97%   BMI 27.17 kg/m  GENERAL: Patient is a well appearing female in no acute distress HEENT:  Sclerae anicteric.  Oropharynx clear and moist. No ulcerations or evidence of oropharyngeal candidiasis. Neck is supple.  NODES:  No cervical, supraclavicular, or axillary lymphadenopathy  palpated.  BREAST EXAM:  Deferred. LUNGS:  Clear to auscultation bilaterally.  No wheezes or rhonchi. HEART:  Regular rate and rhythm. No murmur appreciated. ABDOMEN:  Soft, nontender.  Positive, normoactive bowel sounds. No organomegaly palpated. MSK:  No focal spinal tenderness to palpation. Full range of motion bilaterally in the upper extremities. EXTREMITIES:  No peripheral edema.   SKIN:  Clear with no obvious rashes or skin changes. No nail dyscrasia. NEURO:  Nonfocal. Well oriented.  Appropriate affect.   LABORATORY DATA:  None for this visit.  DIAGNOSTIC IMAGING:  None for this visit.      ASSESSMENT AND PLAN:  Ms.. Teresa Love is a pleasant 68 y.o. female with Stage *** right/left breast invasive ductal carcinoma, ER+/PR+/HER2-, diagnosed in ***, treated with lumpectomy, adjuvant radiation therapy, and anti-estrogen therapy with *** beginning in ***.  She presents to the Survivorship Clinic for our initial meeting and routine follow-up post-completion of treatment for breast cancer.    1. Stage *** right/left breast cancer:  Ms. Schow is continuing to recover from definitive treatment for breast cancer. She will follow-up with her medical oncologist, Dr.  Marland Kitchen with history and physical exam per surveillance protocol.  She will continue her anti-estrogen therapy with ***. Thus far, she is tolerating the *** well, with minimal side effects. Her mammogram is due ***; orders placed today.   Today, a comprehensive survivorship care plan and treatment summary was reviewed with the patient today detailing her breast cancer diagnosis, treatment course, potential late/long-term effects of treatment, appropriate follow-up care with recommendations for the future, and patient education resources.  A copy of this summary, along with a letter will be sent to the patient's primary care provider via mail/fax/In Basket message after today's visit.    #. Problem(s) at Visit______________  #. Bone  health:  Given Ms. Foell's age/history of breast cancer and her current treatment regimen including anti-estrogen therapy with ***, she is at risk for bone demineralization.  Her last DEXA scan was ***, which showed ***.  In the meantime, she was encouraged to increase her consumption of foods rich in calcium, as well as increase her weight-bearing activities.  She was given education on specific activities to promote bone health.  #. Cancer screening:  Due to Ms. Eckerman's history and her age, she should receive screening for skin cancers, colon cancer, and gynecologic cancers.  The information and recommendations are listed on the patient's comprehensive care plan/treatment summary and were reviewed in detail with the  patient.    #. Health maintenance and wellness promotion: Ms. Cowper was encouraged to consume 5-7 servings of fruits and vegetables per day. We reviewed the "Nutrition Rainbow" handout.  She was also encouraged to engage in moderate to vigorous exercise for 30 minutes per day most days of the week.  She was instructed to limit her alcohol consumption and continue to abstain from tobacco use/***was encouraged stop smoking.     #. Support services/counseling: It is not uncommon for this period of the patient's cancer care trajectory to be one of many emotions and stressors.   She was given information regarding our available services and encouraged to contact me with any questions or for help enrolling in any of our support group/programs.    Follow up instructions:    -Return to cancer center ***  -Mammogram due in *** -She is welcome to return back to the Survivorship Clinic at any time; no additional follow-up needed at this time.  -Consider referral back to survivorship as a long-term survivor for continued surveillance  The patient was provided an opportunity to ask questions and all were answered. The patient agreed with the plan and demonstrated an understanding of the  instructions.   Total encounter time:*** minutes*in face-to-face visit time, chart review, lab review, care coordination, order entry, and documentation of the encounter time.    Lillard Anes, NP 11/22/23 1:48 PM Medical Oncology and Hematology [facility] [address] [place], [place] [address] Tel. [phone]    Fax. [phone]  *Total Encounter Time as defined by the Centers for Medicare and Medicaid Services includes, in addition to the face-to-face time of a patient visit (documented in the note above) non-face-to-face time: obtaining and reviewing outside history, ordering and reviewing medications, tests or procedures, care coordination (communications with other health care professionals or caregivers) and documentation in the medical record.

## 2023-11-23 ENCOUNTER — Telehealth: Payer: Self-pay | Admitting: Hematology and Oncology

## 2023-11-23 NOTE — Telephone Encounter (Signed)
 Spoke with pt, scheduled appointment, pt aware of time and date

## 2023-11-28 DIAGNOSIS — C50919 Malignant neoplasm of unspecified site of unspecified female breast: Secondary | ICD-10-CM | POA: Diagnosis not present

## 2023-11-28 DIAGNOSIS — F325 Major depressive disorder, single episode, in full remission: Secondary | ICD-10-CM | POA: Diagnosis not present

## 2023-11-28 DIAGNOSIS — K219 Gastro-esophageal reflux disease without esophagitis: Secondary | ICD-10-CM | POA: Diagnosis not present

## 2023-11-28 DIAGNOSIS — M8580 Other specified disorders of bone density and structure, unspecified site: Secondary | ICD-10-CM | POA: Diagnosis not present

## 2023-11-28 DIAGNOSIS — R030 Elevated blood-pressure reading, without diagnosis of hypertension: Secondary | ICD-10-CM | POA: Diagnosis not present

## 2023-11-28 DIAGNOSIS — Z79890 Hormone replacement therapy (postmenopausal): Secondary | ICD-10-CM | POA: Diagnosis not present

## 2023-11-28 DIAGNOSIS — Z008 Encounter for other general examination: Secondary | ICD-10-CM | POA: Diagnosis not present

## 2023-11-28 DIAGNOSIS — G8929 Other chronic pain: Secondary | ICD-10-CM | POA: Diagnosis not present

## 2023-11-28 DIAGNOSIS — Z79899 Other long term (current) drug therapy: Secondary | ICD-10-CM | POA: Diagnosis not present

## 2023-11-28 DIAGNOSIS — E785 Hyperlipidemia, unspecified: Secondary | ICD-10-CM | POA: Diagnosis not present

## 2023-11-28 DIAGNOSIS — F419 Anxiety disorder, unspecified: Secondary | ICD-10-CM | POA: Diagnosis not present

## 2023-12-28 DIAGNOSIS — K219 Gastro-esophageal reflux disease without esophagitis: Secondary | ICD-10-CM | POA: Diagnosis not present

## 2023-12-28 DIAGNOSIS — M8580 Other specified disorders of bone density and structure, unspecified site: Secondary | ICD-10-CM | POA: Diagnosis not present

## 2023-12-28 DIAGNOSIS — E039 Hypothyroidism, unspecified: Secondary | ICD-10-CM | POA: Diagnosis not present

## 2023-12-28 DIAGNOSIS — E782 Mixed hyperlipidemia: Secondary | ICD-10-CM | POA: Diagnosis not present

## 2023-12-28 DIAGNOSIS — G4700 Insomnia, unspecified: Secondary | ICD-10-CM | POA: Diagnosis not present

## 2023-12-28 DIAGNOSIS — Z170 Estrogen receptor positive status [ER+]: Secondary | ICD-10-CM | POA: Diagnosis not present

## 2023-12-28 DIAGNOSIS — C50912 Malignant neoplasm of unspecified site of left female breast: Secondary | ICD-10-CM | POA: Diagnosis not present

## 2023-12-28 DIAGNOSIS — F419 Anxiety disorder, unspecified: Secondary | ICD-10-CM | POA: Diagnosis not present

## 2023-12-28 DIAGNOSIS — F331 Major depressive disorder, recurrent, moderate: Secondary | ICD-10-CM | POA: Diagnosis not present

## 2023-12-31 DIAGNOSIS — C50412 Malignant neoplasm of upper-outer quadrant of left female breast: Secondary | ICD-10-CM | POA: Diagnosis not present

## 2023-12-31 DIAGNOSIS — Z170 Estrogen receptor positive status [ER+]: Secondary | ICD-10-CM | POA: Diagnosis not present

## 2024-01-05 ENCOUNTER — Telehealth: Payer: Self-pay | Admitting: *Deleted

## 2024-01-05 NOTE — Telephone Encounter (Signed)
 Received call from pt with complaint of generalized joint pain and stiffness x1 month. Per MD pt needing to stop Anastrozole  x2 weeks and contact our office to see if symptoms improve.  If symptoms improved, MD states pt will need to start Letrozole.  If symptoms continue, pt will need to re-start Anastrozole  and f/u with PCP.  Pt educated and verbalized understanding.

## 2024-01-18 ENCOUNTER — Other Ambulatory Visit: Payer: Self-pay | Admitting: *Deleted

## 2024-01-18 MED ORDER — LETROZOLE 2.5 MG PO TABS: 2.5000 mg | ORAL_TABLET | Freq: Every day | ORAL | 1 refills | Status: DC

## 2024-01-18 NOTE — Progress Notes (Signed)
 Received call from pt stating she has stopped Anastrozole  x 2 weeks and joint pain/ stiffness has resolved.  RN reviewed with MD and verbal orders received and placed for pt to start Letrozole 2.5 mg tablet p.o daily and f/u in 6 weeks.  Prescription sent to pharmacy on file, pt educated and verbalized understanding.

## 2024-02-14 DIAGNOSIS — Z01419 Encounter for gynecological examination (general) (routine) without abnormal findings: Secondary | ICD-10-CM | POA: Diagnosis not present

## 2024-02-14 DIAGNOSIS — N958 Other specified menopausal and perimenopausal disorders: Secondary | ICD-10-CM | POA: Diagnosis not present

## 2024-02-27 NOTE — Assessment & Plan Note (Signed)
 06/02/2023:Left lumpectomy: Grade 1 IDC 1.1 cm, Marg negative for lymphovascular invasion ER 100%, PR 100%, HER2 1+ negative, Ki-67 2% oncotype results of 7/3%    Treatment plan: Adjuvant radiation 07/14/2023-08/11/2023 Adjuvant antiestrogen therapy with anastrozole  1 mg daily to start 08/25/2023   Anastrozole  Toxicities:  Breast Cancer Surveillance: Breast Exam: 02/28/24: Benign Mammogram: 06/30/2023: Benign

## 2024-02-28 ENCOUNTER — Inpatient Hospital Stay: Attending: Hematology and Oncology | Admitting: Hematology and Oncology

## 2024-02-28 VITALS — BP 122/72 | HR 75 | Temp 97.8°F | Resp 17 | Ht 65.0 in | Wt 163.0 lb

## 2024-02-28 DIAGNOSIS — N951 Menopausal and female climacteric states: Secondary | ICD-10-CM | POA: Insufficient documentation

## 2024-02-28 DIAGNOSIS — Z79811 Long term (current) use of aromatase inhibitors: Secondary | ICD-10-CM | POA: Diagnosis not present

## 2024-02-28 DIAGNOSIS — Z170 Estrogen receptor positive status [ER+]: Secondary | ICD-10-CM | POA: Insufficient documentation

## 2024-02-28 DIAGNOSIS — C50412 Malignant neoplasm of upper-outer quadrant of left female breast: Secondary | ICD-10-CM | POA: Diagnosis not present

## 2024-02-28 MED ORDER — EXEMESTANE 25 MG PO TABS
25.0000 mg | ORAL_TABLET | Freq: Every day | ORAL | 3 refills | Status: AC
Start: 2024-02-28 — End: ?

## 2024-02-28 MED ORDER — ESTRADIOL 0.1 MG/GM VA CREA
1.0000 | TOPICAL_CREAM | VAGINAL | 12 refills | Status: DC
Start: 2024-02-28 — End: 2024-04-06

## 2024-02-28 NOTE — Progress Notes (Signed)
 Patient Care Team: Ransom Other, MD as PCP - General (Internal Medicine) Odean Potts, MD as Consulting Physician (Hematology and Oncology) Shannon Agent, MD as Consulting Physician (Radiation Oncology) Curvin Deward MOULD, MD as Consulting Physician (General Surgery)  DIAGNOSIS:  Encounter Diagnosis  Name Primary?   Malignant neoplasm of upper-outer quadrant of left breast in female, estrogen receptor positive (HCC) Yes    SUMMARY OF ONCOLOGIC HISTORY: Oncology History  Malignant neoplasm of upper-outer quadrant of left breast in female, estrogen receptor positive (HCC)  05/12/2023 Initial Diagnosis   Screening mammogram detected screening mammogram detected left breast asymmetry/architectural distortion which was palpable measuring 0.8 cm, axilla negative, biopsy grade 1 IDC ER 100%, PR 100%, Ki67 2%, HER2 1+ negative   05/19/2023 Cancer Staging   Staging form: Breast, AJCC 8th Edition - Clinical stage from 05/19/2023: Stage IA (cT1b, cN0, cM0, G1, ER+, PR+, HER2-) - Signed by Odean Potts, MD on 05/19/2023 Stage prefix: Initial diagnosis Histologic grading system: 3 grade system Laterality: Left Staged by: Pathologist and managing physician Stage used in treatment planning: Yes National guidelines used in treatment planning: Yes Type of national guideline used in treatment planning: NCCN   06/02/2023 Surgery   Left lumpectomy: Grade 1 IDC 1.1 cm, Marg negative for lymphovascular invasion ER 100%, PR 100%, HER2 1+ negative, Ki-67 2%   06/02/2023 Cancer Staging   Staging form: Breast, AJCC 8th Edition - Pathologic stage from 06/02/2023: Stage IA (pT1c, pN0, cM0, G1, ER+, PR+, HER2-) - Signed by Crawford Morna Pickle, NP on 11/22/2023 Stage prefix: Initial diagnosis Histologic grading system: 3 grade system   06/08/2023 Genetic Testing   Negative [facility] CancerNext-Expanded +RNAinsight Panel.  Report date is 06/08/2023.   The CancerNext-Expanded gene panel offered by [facility] and includes sequencing, rearrangement, and RNA analysis for the following 71 genes:  AIP, ALK, APC, ATM, BAP1, BARD1, BMPR1A, BRCA1, BRCA2, BRIP1, CDC73, CDH1, CDK4, CDKN1B, CDKN2A, CHEK2, DICER1, FH, FLCN, KIF1B, LZTR1,MAX, MEN1, MET, MLH1, MSH2, MSH6, MUTYH, NF1, NF2, NTHL1, PALB2, PHOX2B, PMS2, POT1, PRKAR1A, PTCH1, PTEN, RAD51C,RAD51D, RB1, RET, SDHA, SDHAF2, SDHB, SDHC, SDHD, SMAD4, SMARCA4, SMARCB1, SMARCE1, STK11, SUFU, TMEM127, TP53,TSC1, TSC2 and VHL (sequencing and deletion/duplication); AXIN2, CTNNA1, EGFR, EGLN1, HOXB13, KIT, MITF, MSH3, PDGFRA, POLD1 and POLE (sequencing only); EPCAM and GREM1 (deletion/duplication only).    06/23/2023 Oncotype testing   oncotype results of 7/3%    07/14/2023 - 08/11/2023 Radiation Therapy   Plan Name: Breast_L_BH Site: Breast, Left Technique: 3D Mode: Photon Dose Per Fraction: 2.67 Gy Prescribed Dose (Delivered / Prescribed): 40.05 Gy / 40.05 Gy Prescribed Fxs (Delivered / Prescribed): 15 / 15   Plan Name: Brst_L_Bst_BH Site: Breast, Left Technique: 3D Mode: Photon Dose Per Fraction: 2 Gy Prescribed Dose (Delivered / Prescribed): 12 Gy / 12 Gy Prescribed Fxs (Delivered / Prescribed): 6 / 6   08/2023 -  Anti-estrogen oral therapy   1 mg Anastrozole  daily x 5-7 years     CHIEF COMPLIANT: Follow-up on letrozole   HISTORY OF PRESENT ILLNESS:   History of Present Illness The patient presents with hair loss and nightmares potentially related to letrozole  use.  She experiences significant hair loss since starting letrozole , with progressive thinning and easy hair removal. Despite having thick hair, she notices substantial hair loss during washing and on the floor. She is concerned about the potential for complete hair loss.  She has nightmares characterized by screaming during the night, as observed by her husband. These episodes occur about four times in the last few weeks, although  she does not recall them. Despite this, she  sleeps well and no longer uses Belsomra.  She has been on hormone therapy since January and experienced significant pain with a previous medication. She inquires about vaginal moisturizers due to dryness, which she attributes to menopause and possibly hormone therapy. She has used estrogen cream in the past. Her current medications include vitamins and a few prescriptions, excluding Diflucan .     ALLERGIES:  is allergic to amoxicillin-pot clavulanate and statins.  MEDICATIONS:  Current Outpatient Medications  Medication Sig Dispense Refill   ALPRAZolam (XANAX) 0.5 MG tablet Take 0.5 mg by mouth 2 (two) times daily as needed for anxiety or sleep.     Biotin 5000 MCG CAPS Take 1 capsule by mouth 3 (three) times a week.     cholecalciferol (VITAMIN D3) 25 MCG (1000 UNIT) tablet Take 1,000 Units by mouth daily.     citalopram (CELEXA) 20 MG tablet Take 20 mg by mouth at bedtime.     cyanocobalamin (VITAMIN B12) 1000 MCG tablet Take 1,000 mcg by mouth daily.     fluconazole  (DIFLUCAN ) 150 MG tablet Take 1 tablet (150 mg total) by mouth as needed. For yeast 10 tablet 1   influenza vaccine adjuvanted (FLUAD ) 0.5 ML injection Inject into the muscle. 0.5 mL 0   letrozole  (FEMARA ) 2.5 MG tablet Take 1 tablet (2.5 mg total) by mouth daily. 90 tablet 1   levothyroxine (SYNTHROID) 100 MCG tablet Take 100 mcg by mouth at bedtime. PT ONLY TAKES MON-FRI     pravastatin (PRAVACHOL) 40 MG tablet Take 40 mg by mouth daily. Takes at bedtime     Turmeric 500 MG CAPS Take by mouth.     No current facility-administered medications for this visit.    PHYSICAL EXAMINATION: ECOG PERFORMANCE STATUS: 1 - Symptomatic but completely ambulatory  Vitals:   02/28/24 1024  BP: 122/72  Pulse: 75  Resp: 17  Temp: 97.8 F (36.6 C)  SpO2: 99%   Filed Weights   02/28/24 1024  Weight: 163 lb (73.9 kg)      LABORATORY DATA:  I have reviewed the data as listed    Latest Ref Rng & Units 05/19/2023   12:38 PM  10/03/2015   12:26 PM  CMP  Glucose 70 - 99 mg/dL 86  86   BUN 8 - 23 mg/dL 14  12   Creatinine 9.55 - 1.00 mg/dL 9.16  9.11   Sodium 864 - 145 mmol/L 137  139   Potassium 3.5 - 5.1 mmol/L 4.2  4.9   Chloride 98 - 111 mmol/L 105  103   CO2 22 - 32 mmol/L 27  24   Calcium 8.9 - 10.3 mg/dL 9.5  9.6   Total Protein 6.5 - 8.1 g/dL 7.1    Total Bilirubin 0.3 - 1.2 mg/dL 0.5    Alkaline Phos 38 - 126 U/L 83    AST 15 - 41 U/L 14    ALT 0 - 44 U/L 10      Lab Results  Component Value Date   WBC 5.7 05/19/2023   HGB 13.4 05/19/2023   HCT 40.8 05/19/2023   MCV 92.3 05/19/2023   PLT 282 05/19/2023   NEUTROABS 3.1 05/19/2023    ASSESSMENT & PLAN:  Malignant neoplasm of upper-outer quadrant of left breast in female, estrogen receptor positive (HCC) 06/02/2023:Left lumpectomy: Grade 1 IDC 1.1 cm, Marg negative for lymphovascular invasion ER 100%, PR 100%, HER2 1+ negative, Ki-67 2% oncotype results of  7/3%    Treatment plan: Adjuvant radiation 07/14/2023-08/11/2023 Adjuvant antiestrogen therapy with anastrozole  1 mg daily started 08/25/2023 switched to letrozole , switched to exemestane  02/28/2020 because of hair loss and nightmares     Breast Cancer Surveillance: Breast Exam: 02/28/24: Benign Mammogram: 06/30/2023: Benign ------------------------------------- Assessment and Plan Assessment & Plan Adverse effects of letrozole  Significant hair thinning and nightmares attributed to letrozole . She is distressed and willing to switch medications. - Discontinue letrozole  for two weeks. - Switch to a exemestane    Vaginal dryness due to menopause and hormone therapy Significant vaginal dryness due to menopause and hormone therapy. Discussed non-hormonal moisturizers and topical estrogen, with no increased breast cancer risk from topical estrogen. - Prescribe topical estrogen cream twice weekly. - Use [facility] for dispensing.      No orders of the defined types were placed in this  encounter.  The patient has a good understanding of the overall plan. she agrees with it. she will call with any problems that may develop before the next visit here. Total time spent: 30 mins including face to face time and time spent for planning, charting and co-ordination of care   Viinay K Sante Biedermann, MD 02/28/24

## 2024-03-30 DIAGNOSIS — C50412 Malignant neoplasm of upper-outer quadrant of left female breast: Secondary | ICD-10-CM | POA: Diagnosis not present

## 2024-03-30 DIAGNOSIS — Z170 Estrogen receptor positive status [ER+]: Secondary | ICD-10-CM | POA: Diagnosis not present

## 2024-04-06 ENCOUNTER — Other Ambulatory Visit: Payer: Self-pay | Admitting: *Deleted

## 2024-04-06 MED ORDER — ESTRADIOL 0.1 MG/GM VA CREA: 1.0000 | TOPICAL_CREAM | VAGINAL | 12 refills | Status: DC

## 2024-04-10 ENCOUNTER — Telehealth: Payer: Self-pay

## 2024-04-10 NOTE — Telephone Encounter (Signed)
 S/w patient regarding estradiol  use. Patient reports using entire tube within the past week and is not able to get any additional refills. Patient aware that she can use Uberlube which is OTC in the interim.  Dr. Odean cannot refill estradiol  at this time. Reviewed how to use estradiol  for the future use.  Patient verbalized an understanding of the information and voiced appreciation for the call.

## 2024-04-13 ENCOUNTER — Other Ambulatory Visit: Payer: Self-pay | Admitting: *Deleted

## 2024-04-13 MED ORDER — ESTRADIOL 0.1 MG/GM VA CREA: 1.0000 | TOPICAL_CREAM | VAGINAL | 12 refills | Status: AC

## 2024-05-04 DIAGNOSIS — R92323 Mammographic fibroglandular density, bilateral breasts: Secondary | ICD-10-CM | POA: Diagnosis not present

## 2024-05-04 DIAGNOSIS — R928 Other abnormal and inconclusive findings on diagnostic imaging of breast: Secondary | ICD-10-CM | POA: Diagnosis not present

## 2024-05-11 ENCOUNTER — Encounter: Payer: Self-pay | Admitting: Adult Health

## 2024-05-24 ENCOUNTER — Inpatient Hospital Stay: Attending: Hematology and Oncology | Admitting: Hematology and Oncology

## 2024-05-24 DIAGNOSIS — C50412 Malignant neoplasm of upper-outer quadrant of left female breast: Secondary | ICD-10-CM | POA: Diagnosis not present

## 2024-05-24 DIAGNOSIS — Z170 Estrogen receptor positive status [ER+]: Secondary | ICD-10-CM

## 2024-05-24 NOTE — Assessment & Plan Note (Signed)
 06/02/2023:Left lumpectomy: Grade 1 IDC 1.1 cm, Marg negative for lymphovascular invasion ER 100%, PR 100%, HER2 1+ negative, Ki-67 2% oncotype results of 7/3%    Treatment plan: Adjuvant radiation 07/14/2023-08/11/2023 Adjuvant antiestrogen therapy with anastrozole  1 mg daily started 08/25/2023 switched to letrozole , switched to exemestane  02/28/2020 because of hair loss and nightmares    Breast Cancer Surveillance: Breast Exam: 05/24/2024: Benign Mammogram: 05/04/2024: Benign, breast density category B  Return to clinic in 1 year for follow-up

## 2024-05-24 NOTE — Progress Notes (Signed)
 HEMATOLOGY-ONCOLOGY TELEPHONE VISIT PROGRESS NOTE  I connected with our patient on 05/24/24 at 11:00 AM EDT by telephone and verified that I am speaking with the correct person using two identifiers.  I discussed the limitations, risks, security and privacy concerns of performing an evaluation and management service by telephone and the availability of in person appointments.  I also discussed with the patient that there may be a patient responsible charge related to this service. The patient expressed understanding and agreed to proceed.   History of Present Illness:  History of Present Illness Teresa Love [name] is a 68 year old female who presents for a follow-up visit regarding her hair loss and eczema treatment.  Her hair loss has improved, and she is satisfied with the progress. Eczema treatment is effective with consistent medication use. A recent mammogram on September 11th showed no abnormalities.    Oncology History  Malignant neoplasm of upper-outer quadrant of left breast in female, estrogen receptor positive (HCC)  05/12/2023 Initial Diagnosis   Screening mammogram detected screening mammogram detected left breast asymmetry/architectural distortion which was palpable measuring 0.8 cm, axilla negative, biopsy grade 1 IDC ER 100%, PR 100%, Ki67 2%, HER2 1+ negative   05/19/2023 Cancer Staging   Staging form: Breast, AJCC 8th Edition - Clinical stage from 05/19/2023: Stage IA (cT1b, cN0, cM0, G1, ER+, PR+, HER2-) - Signed by Odean Potts, MD on 05/19/2023 Stage prefix: Initial diagnosis Histologic grading system: 3 grade system Laterality: Left Staged by: Pathologist and managing physician Stage used in treatment planning: Yes National guidelines used in treatment planning: Yes Type of national guideline used in treatment planning: NCCN   06/02/2023 Surgery   Left lumpectomy: Grade 1 IDC 1.1 cm, Marg negative for lymphovascular invasion ER 100%, PR 100%, HER2 1+ negative, Ki-67  2%   06/02/2023 Cancer Staging   Staging form: Breast, AJCC 8th Edition - Pathologic stage from 06/02/2023: Stage IA (pT1c, pN0, cM0, G1, ER+, PR+, HER2-) - Signed by Crawford Morna Pickle, NP on 11/22/2023 Stage prefix: Initial diagnosis Histologic grading system: 3 grade system   06/08/2023 Genetic Testing   Negative [facility] CancerNext-Expanded +RNAinsight Panel.  Report date is 06/08/2023.   The CancerNext-Expanded gene panel offered by [facility] and includes sequencing, rearrangement, and RNA analysis for the following 71 genes:  AIP, ALK, APC, ATM, BAP1, BARD1, BMPR1A, BRCA1, BRCA2, BRIP1, CDC73, CDH1, CDK4, CDKN1B, CDKN2A, CHEK2, DICER1, FH, FLCN, KIF1B, LZTR1,MAX, MEN1, MET, MLH1, MSH2, MSH6, MUTYH, NF1, NF2, NTHL1, PALB2, PHOX2B, PMS2, POT1, PRKAR1A, PTCH1, PTEN, RAD51C,RAD51D, RB1, RET, SDHA, SDHAF2, SDHB, SDHC, SDHD, SMAD4, SMARCA4, SMARCB1, SMARCE1, STK11, SUFU, TMEM127, TP53,TSC1, TSC2 and VHL (sequencing and deletion/duplication); AXIN2, CTNNA1, EGFR, EGLN1, HOXB13, KIT, MITF, MSH3, PDGFRA, POLD1 and POLE (sequencing only); EPCAM and GREM1 (deletion/duplication only).    06/23/2023 Oncotype testing   oncotype results of 7/3%    07/14/2023 - 08/11/2023 Radiation Therapy   Plan Name: Breast_L_BH Site: Breast, Left Technique: 3D Mode: Photon Dose Per Fraction: 2.67 Gy Prescribed Dose (Delivered / Prescribed): 40.05 Gy / 40.05 Gy Prescribed Fxs (Delivered / Prescribed): 15 / 15   Plan Name: Brst_L_Bst_BH Site: Breast, Left Technique: 3D Mode: Photon Dose Per Fraction: 2 Gy Prescribed Dose (Delivered / Prescribed): 12 Gy / 12 Gy Prescribed Fxs (Delivered / Prescribed): 6 / 6   08/2023 -  Anti-estrogen oral therapy   1 mg Anastrozole  daily x 5-7 years     REVIEW OF SYSTEMS:   Constitutional: Denies fevers, chills or abnormal weight loss All other systems  were reviewed with the patient and are negative. Observations/Objective:     Assessment Plan:  Malignant  neoplasm of upper-outer quadrant of left breast in female, estrogen receptor positive (HCC) 06/02/2023:Left lumpectomy: Grade 1 IDC 1.1 cm, Marg negative for lymphovascular invasion ER 100%, PR 100%, HER2 1+ negative, Ki-67 2% oncotype results of 7/3%    Treatment plan: Adjuvant radiation 07/14/2023-08/11/2023 Adjuvant antiestrogen therapy with anastrozole  1 mg daily started 08/25/2023 switched to letrozole , switched to exemestane  02/28/2020 because of hair loss and nightmares  Exemestane  toxicity: No further problems with hair loss. She tells me that she can tolerate this medicine without any problems or concerns.    Breast Cancer Surveillance: Mammogram: 05/04/2024: Benign, breast density category B  Return to clinic in 1 year for follow-up  Assessment & Plan Malignant neoplasm of upper-outer quadrant of left breast Recent mammogram showed no concerning findings, indicating well-managed disease status. - Schedule follow-up appointment in one year.      I discussed the assessment and treatment plan with the patient. The patient was provided an opportunity to ask questions and all were answered. The patient agreed with the plan and demonstrated an understanding of the instructions. The patient was advised to call back or seek an in-person evaluation if the symptoms worsen or if the condition fails to improve as anticipated.   I provided 20 minutes of non-face-to-face time during this encounter.  This includes time for charting and coordination of care   Naomi MARLA Chad, MD

## 2024-06-15 DIAGNOSIS — L570 Actinic keratosis: Secondary | ICD-10-CM | POA: Diagnosis not present

## 2024-06-15 DIAGNOSIS — L814 Other melanin hyperpigmentation: Secondary | ICD-10-CM | POA: Diagnosis not present

## 2024-06-15 DIAGNOSIS — L820 Inflamed seborrheic keratosis: Secondary | ICD-10-CM | POA: Diagnosis not present

## 2024-06-15 DIAGNOSIS — D225 Melanocytic nevi of trunk: Secondary | ICD-10-CM | POA: Diagnosis not present

## 2024-06-15 DIAGNOSIS — L821 Other seborrheic keratosis: Secondary | ICD-10-CM | POA: Diagnosis not present

## 2024-06-15 DIAGNOSIS — Z85828 Personal history of other malignant neoplasm of skin: Secondary | ICD-10-CM | POA: Diagnosis not present

## 2024-06-15 DIAGNOSIS — D2272 Melanocytic nevi of left lower limb, including hip: Secondary | ICD-10-CM | POA: Diagnosis not present

## 2024-06-15 DIAGNOSIS — L578 Other skin changes due to chronic exposure to nonionizing radiation: Secondary | ICD-10-CM | POA: Diagnosis not present

## 2024-06-15 DIAGNOSIS — D2271 Melanocytic nevi of right lower limb, including hip: Secondary | ICD-10-CM | POA: Diagnosis not present

## 2024-06-30 DIAGNOSIS — R21 Rash and other nonspecific skin eruption: Secondary | ICD-10-CM | POA: Diagnosis not present

## 2024-06-30 DIAGNOSIS — J309 Allergic rhinitis, unspecified: Secondary | ICD-10-CM | POA: Diagnosis not present

## 2024-06-30 DIAGNOSIS — Z0000 Encounter for general adult medical examination without abnormal findings: Secondary | ICD-10-CM | POA: Diagnosis not present

## 2024-06-30 DIAGNOSIS — E559 Vitamin D deficiency, unspecified: Secondary | ICD-10-CM | POA: Diagnosis not present

## 2024-06-30 DIAGNOSIS — E039 Hypothyroidism, unspecified: Secondary | ICD-10-CM | POA: Diagnosis not present

## 2024-06-30 DIAGNOSIS — F419 Anxiety disorder, unspecified: Secondary | ICD-10-CM | POA: Diagnosis not present

## 2024-06-30 DIAGNOSIS — Z23 Encounter for immunization: Secondary | ICD-10-CM | POA: Diagnosis not present

## 2024-06-30 DIAGNOSIS — E782 Mixed hyperlipidemia: Secondary | ICD-10-CM | POA: Diagnosis not present

## 2024-06-30 DIAGNOSIS — C50912 Malignant neoplasm of unspecified site of left female breast: Secondary | ICD-10-CM | POA: Diagnosis not present

## 2024-06-30 DIAGNOSIS — M8580 Other specified disorders of bone density and structure, unspecified site: Secondary | ICD-10-CM | POA: Diagnosis not present

## 2024-06-30 DIAGNOSIS — Z1331 Encounter for screening for depression: Secondary | ICD-10-CM | POA: Diagnosis not present

## 2024-06-30 DIAGNOSIS — F331 Major depressive disorder, recurrent, moderate: Secondary | ICD-10-CM | POA: Diagnosis not present

## 2024-07-11 DIAGNOSIS — H2513 Age-related nuclear cataract, bilateral: Secondary | ICD-10-CM | POA: Diagnosis not present

## 2024-07-11 DIAGNOSIS — H52203 Unspecified astigmatism, bilateral: Secondary | ICD-10-CM | POA: Diagnosis not present

## 2024-07-31 DIAGNOSIS — L570 Actinic keratosis: Secondary | ICD-10-CM | POA: Diagnosis not present

## 2024-07-31 DIAGNOSIS — L821 Other seborrheic keratosis: Secondary | ICD-10-CM | POA: Diagnosis not present

## 2024-07-31 DIAGNOSIS — L820 Inflamed seborrheic keratosis: Secondary | ICD-10-CM | POA: Diagnosis not present

## 2024-07-31 DIAGNOSIS — Z85828 Personal history of other malignant neoplasm of skin: Secondary | ICD-10-CM | POA: Diagnosis not present

## 2024-08-08 ENCOUNTER — Telehealth: Payer: Self-pay | Admitting: *Deleted

## 2024-08-08 ENCOUNTER — Telehealth: Payer: Self-pay

## 2024-08-08 NOTE — Telephone Encounter (Addendum)
 Teresa Love called in asking for clearance to resume wearing invisaligns. She stated that her dentist said that she had to sign a waiver because it has been less than 2 years.

## 2024-08-08 NOTE — Telephone Encounter (Signed)
 Received call from pt requesting advice from MD if okay to proceed with invisalign retainer 1 year post radiation.  Pt states Dr. Shannon advised she could proceed 1 year post but her dentist is now requesting her to sign a form if it has been less than 2 years.  RN transferred pt to radiation team to further assess.

## 2025-05-28 ENCOUNTER — Ambulatory Visit: Admitting: Hematology and Oncology
